# Patient Record
Sex: Female | Born: 1995
Health system: Southern US, Community
[De-identification: ages and names within clinical notes are randomized; demographics above are authoritative.]

## PROBLEM LIST (undated history)

## (undated) DIAGNOSIS — D649 Anemia, unspecified: Secondary | ICD-10-CM

## (undated) DIAGNOSIS — J45909 Unspecified asthma, uncomplicated: Secondary | ICD-10-CM

## (undated) DIAGNOSIS — Z8639 Personal history of other endocrine, nutritional and metabolic disease: Secondary | ICD-10-CM

## (undated) HISTORY — PX: ADENOIDECTOMY: SUR15

## (undated) HISTORY — DX: Anemia, unspecified: D64.9

## (undated) HISTORY — DX: Personal history of other endocrine, nutritional and metabolic disease: Z86.39

## (undated) HISTORY — DX: Unspecified asthma, uncomplicated: J45.909

## (undated) HISTORY — PX: TYMPANOSTOMY TUBE PLACEMENT: SHX32

---
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
## 2017-12-14 ENCOUNTER — Other Ambulatory Visit: Payer: Self-pay | Admitting: *Deleted

## 2017-12-14 MED ORDER — NORETHIN ACE-ETH ESTRAD-FE 1-20 MG-MCG PO TABS: 1.0000 | ORAL_TABLET | Freq: Every day | ORAL | 2 refills | Status: DC

## 2018-06-08 ENCOUNTER — Ambulatory Visit (INDEPENDENT_AMBULATORY_CARE_PROVIDER_SITE_OTHER): Payer: BLUE CROSS/BLUE SHIELD | Admitting: Family Medicine

## 2018-06-08 DIAGNOSIS — Z23 Encounter for immunization: Secondary | ICD-10-CM

## 2018-08-18 ENCOUNTER — Other Ambulatory Visit: Payer: Self-pay | Admitting: Sports Medicine

## 2018-08-21 ENCOUNTER — Other Ambulatory Visit: Payer: Self-pay | Admitting: *Deleted

## 2018-08-21 MED ORDER — NORETHIN ACE-ETH ESTRAD-FE 1-20 MG-MCG PO TABS: 1.0000 | ORAL_TABLET | Freq: Every day | ORAL | 1 refills | Status: DC

## 2018-10-31 ENCOUNTER — Telehealth: Payer: Self-pay | Admitting: Family Medicine

## 2018-10-31 NOTE — Telephone Encounter (Signed)
Copied from CRM [id number]. Topic: General - Other >> Oct 31, 2018  9:04 AM Herby Abraham C wrote: Reason for CRM:   Pt called in to request a copy of her immunizations. Pt says that she need this for school.    CB: [phone] -

## 2018-11-01 NOTE — Telephone Encounter (Signed)
Copy pulled from [facility] registry. Patient was informed copy at front desk

## 2018-11-02 NOTE — Telephone Encounter (Signed)
I got a verbal auth from her and printed [facility]IR off. Her dad came by and picked it up. She would now like to know if she needs any other shots for school. Please advise her at [phone]

## 2019-02-02 ENCOUNTER — Other Ambulatory Visit: Payer: Self-pay | Admitting: Sports Medicine

## 2019-02-03 ENCOUNTER — Other Ambulatory Visit: Payer: Self-pay

## 2019-02-03 ENCOUNTER — Ambulatory Visit (INDEPENDENT_AMBULATORY_CARE_PROVIDER_SITE_OTHER): Payer: BC Managed Care – PPO

## 2019-02-03 ENCOUNTER — Encounter (HOSPITAL_COMMUNITY): Payer: Self-pay | Admitting: *Deleted

## 2019-02-03 ENCOUNTER — Ambulatory Visit (HOSPITAL_COMMUNITY)
Admission: EM | Admit: 2019-02-03 | Discharge: 2019-02-03 | Disposition: A | Discharge status: Home / Self Care | Payer: BC Managed Care – PPO | Attending: Urgent Care | Admitting: Urgent Care

## 2019-02-03 DIAGNOSIS — M25572 Pain in left ankle and joints of left foot: Secondary | ICD-10-CM | POA: Diagnosis not present

## 2019-02-03 DIAGNOSIS — S99912A Unspecified injury of left ankle, initial encounter: Secondary | ICD-10-CM | POA: Diagnosis not present

## 2019-02-03 DIAGNOSIS — S93402A Sprain of unspecified ligament of left ankle, initial encounter: Secondary | ICD-10-CM | POA: Diagnosis not present

## 2019-02-03 DIAGNOSIS — M7989 Other specified soft tissue disorders: Secondary | ICD-10-CM | POA: Diagnosis not present

## 2019-02-03 MED ORDER — NAPROXEN 500 MG PO TABS: 500.0000 mg | ORAL_TABLET | Freq: Two times a day (BID) | ORAL | 0 refills | Status: DC

## 2019-02-03 MED ORDER — CYCLOBENZAPRINE HCL 10 MG PO TABS: 10.0000 mg | ORAL_TABLET | Freq: Every day | ORAL | 0 refills | Status: DC

## 2019-02-03 NOTE — ED Notes (Addendum)
States took IBU @ approx 1600.

## 2019-02-03 NOTE — ED Notes (Signed)
Upon assisting pt out to vehicle, mother stated they would actually like to decline crutches, as they have a pair at home.

## 2019-02-03 NOTE — ED Notes (Addendum)
Ice pack provided and placed to left lateral ankle.

## 2019-02-03 NOTE — Discharge Instructions (Addendum)
You can ice 20 minutes every 2 hours for the first 48 hours. After any shift from work, icing can help for 20 minutes when you get home.

## 2019-02-03 NOTE — ED Triage Notes (Signed)
Reports walking on uneven ground when she rolled left ankle, hearing/feeling "pops".  Swelling noted to left lateral ankle; states unable to bear any weight or touch area.  Declines elevating LLE due to pain.

## 2019-02-03 NOTE — ED Provider Notes (Signed)
MRN: [id number] DOB: May 13, 1996  Subjective:   Susan Cohen is a 23 y.o. female presenting for suffering a left ankle injury today.  Patient was walking on uneven ground and rolled her ankle outwardly.  She states that she heard a pop and has had severe pain since then with swelling.  She is not able to bear any weight on it and has not even been able to touch her ankle as it hurts severely.  Patient took ibuprofen just prior to coming to the clinic.  No current facility-administered medications for this encounter.   Current Outpatient Medications:  .  norethindrone-ethinyl estradiol (JUNEL FE,GILDESS FE,LOESTRIN FE) 1-20 MG-MCG tablet, Take 1 tablet by mouth daily., Disp: 90 tablet, Rfl: 1 .  albuterol (PROVENTIL HFA;VENTOLIN HFA) 108 (90 Base) MCG/ACT inhaler, Inhale 2 puffs into the lungs every 6 (six) hours as needed for wheezing or shortness of breath., Disp: 1 Inhaler, Rfl: 6 .  beclomethasone (QVAR) 80 MCG/ACT inhaler, Inhale 2 puffs into the lungs 2 (two) times daily., Disp: 1 Inhaler, Rfl: 12 .  cefdinir (OMNICEF) 300 MG capsule, Take 2 capsules (600 mg total) by mouth daily. (Patient not taking: Reported on 06/03/2016), Disp: 10 capsule, Rfl: 0 .  ELIDEL 1 % cream, APPLY ON THE SKIN BID PRN, Disp: , Rfl: 0 .  FERROCITE 324 MG TABS tablet, TAKE 1 TABLET BY MOUTH EVERY DAY, Disp: 30 tablet, Rfl: 2 .  ferrous sulfate 325 (65 FE) MG tablet, TAKE 1 TABLET (325 MG TOTAL) BY MOUTH DAILY WITH BREAKFAST., Disp: 30 tablet, Rfl: 3 .  FLUVIRIN SUSP, ADM 0.5ML IM UTD, Disp: , Rfl: 0 .  HYDROcodone-acetaminophen (NORCO) 7.5-325 MG tablet, TK 1 T PO Q 4 TO 6 H PRF PAIN, Disp: , Rfl: 0 .  ibuprofen (ADVIL,MOTRIN) 600 MG tablet, TK 1 T PO TID, Disp: , Rfl: 0 .  ipratropium (ATROVENT) 0.03 % nasal spray, Place 2 sprays into the nose 4 (four) times daily., Disp: 30 mL, Rfl: 6 .  montelukast (SINGULAIR) 10 MG tablet, Take 1 tablet (10 mg total) by mouth at bedtime., Disp: 30 tablet, Rfl: 5 .  nitroGLYCERIN  (NITRODUR - DOSED IN MG/24 HR) 0.2 mg/hr patch, Place 1/4 patch to affected area daily, Disp: 30 patch, Rfl: 1 .  Pseudoephedrine-Guaifenesin (MUCINEX D MAX STRENGTH) [phone] MG TB12, Take 1 tablet by mouth 2 (two) times daily. (Patient not taking: Reported on 06/03/2016), Disp: 20 each, Rfl: 0 .  Spacer/Aero-Holding Chambers (VALVED HOLDING CHAMBER) DEVI, U UTD WITH QVAR, Disp: , Rfl: 0   No Known Allergies  Past Medical History:  Diagnosis Date  . Anemia      Past Surgical History:  Procedure Laterality Date  . TYMPANOSTOMY TUBE PLACEMENT      ROS  Objective:   Vitals: BP 110/62   Resp 20   LMP 01/06/2019 (Exact Date)   SpO2 99%   Physical Exam Constitutional:      General: She is not in acute distress.    Appearance: Normal appearance. She is well-developed. She is not ill-appearing.  HENT:     Head: Normocephalic and atraumatic.     Nose: Nose normal.     Mouth/Throat:     Mouth: Mucous membranes are moist.     Pharynx: Oropharynx is clear.  Eyes:     General: No scleral icterus.    Extraocular Movements: Extraocular movements intact.     Pupils: Pupils are equal, round, and reactive to light.  Cardiovascular:     Rate and  Rhythm: Normal rate.  Pulmonary:     Effort: Pulmonary effort is normal.  Musculoskeletal:     Left ankle: She exhibits decreased range of motion and swelling. She exhibits no ecchymosis, no deformity, no laceration and normal pulse. Tenderness. Lateral malleolus and AITFL tenderness found. No medial malleolus, no posterior TFL, no head of 5th metatarsal and no proximal fibula tenderness found. Achilles tendon exhibits no pain, no defect and normal Thompson's test results.  Skin:    General: Skin is warm and dry.  Neurological:     General: No focal deficit present.     Mental Status: She is alert and oriented to person, place, and time.  Psychiatric:        Mood and Affect: Mood normal.        Behavior: Behavior normal.     Dg Ankle  Complete Left  Result Date: 02/03/2019 CLINICAL DATA:  Pain and swelling.  Injury. EXAM: LEFT ANKLE COMPLETE - 3+ VIEW COMPARISON:  None. FINDINGS: Lateral soft tissue swelling.  No fractures. IMPRESSION: Lateral soft tissue swelling.  No fractures. Electronically Signed   By: Gerome Samavid  Williams III M.D   On: 02/03/2019 17:52    Assessment and Plan :   1. Sprain of left ankle, unspecified ligament, initial encounter   2. Acute left ankle pain     We will manage with an Ace wrap and rice method for left ankle sprain.  Counseled on use of NSAID, muscle relaxant and modification of physical activity.  Anticipatory guidance provided.  Counseled patient on potential for adverse effects with medications prescribed/recommended today, ER and return-to-clinic precautions discussed, patient verbalized understanding.    Wallis BambergMani, Lorelee Mclaurin, [place]-C 02/03/19 [id number]

## 2019-02-10 ENCOUNTER — Other Ambulatory Visit: Payer: Self-pay | Admitting: Sports Medicine

## 2019-02-12 ENCOUNTER — Other Ambulatory Visit: Payer: Self-pay | Admitting: Sports Medicine

## 2019-02-15 ENCOUNTER — Other Ambulatory Visit: Payer: Self-pay | Admitting: *Deleted

## 2019-02-15 MED ORDER — NORETHIN ACE-ETH ESTRAD-FE 1-20 MG-MCG PO TABS: 1.0000 | ORAL_TABLET | Freq: Every day | ORAL | 0 refills | Status: DC

## 2019-02-28 ENCOUNTER — Ambulatory Visit: Payer: BC Managed Care – PPO | Admitting: Sports Medicine

## 2019-03-12 ENCOUNTER — Other Ambulatory Visit: Payer: Self-pay

## 2019-03-13 ENCOUNTER — Encounter: Payer: Self-pay | Admitting: Women's Health

## 2019-03-13 ENCOUNTER — Ambulatory Visit (INDEPENDENT_AMBULATORY_CARE_PROVIDER_SITE_OTHER): Payer: BC Managed Care – PPO | Admitting: Women's Health

## 2019-03-13 VITALS — BP 108/68 | Ht 64.0 in | Wt 114.0 lb

## 2019-03-13 DIAGNOSIS — Z01419 Encounter for gynecological examination (general) (routine) without abnormal findings: Secondary | ICD-10-CM | POA: Diagnosis not present

## 2019-03-13 DIAGNOSIS — R87610 Atypical squamous cells of undetermined significance on cytologic smear of cervix (ASC-US): Secondary | ICD-10-CM | POA: Diagnosis not present

## 2019-03-13 DIAGNOSIS — N898 Other specified noninflammatory disorders of vagina: Secondary | ICD-10-CM | POA: Diagnosis not present

## 2019-03-13 LAB — CBC WITH DIFFERENTIAL/PLATELET
Absolute Monocytes: 660 cells/uL (ref 200–950)
Basophils Absolute: 33 cells/uL (ref 0–200)
Basophils Relative: 0.3 %
Eosinophils Absolute: 231 cells/uL (ref 15–500)
Eosinophils Relative: 2.1 %
HCT: 39.1 % (ref 35.0–45.0)
Hemoglobin: 13.1 g/dL (ref 11.7–15.5)
Lymphs Abs: 3707 cells/uL (ref 850–3900)
MCH: 31.5 pg (ref 27.0–33.0)
MCHC: 33.5 g/dL (ref 32.0–36.0)
MCV: 94 fL (ref 80.0–100.0)
MPV: 10.9 fL (ref 7.5–12.5)
Monocytes Relative: 6 %
Neutro Abs: 6369 cells/uL (ref 1500–7800)
Neutrophils Relative %: 57.9 %
Platelets: 202 10*3/uL (ref 140–400)
RBC: 4.16 10*6/uL (ref 3.80–5.10)
RDW: 12.4 % (ref 11.0–15.0)
Total Lymphocyte: 33.7 %
WBC: 11 10*3/uL — ABNORMAL HIGH (ref 3.8–10.8)

## 2019-03-13 LAB — WET PREP FOR TRICH, YEAST, CLUE

## 2019-03-13 LAB — RESULTS CONSOLE HPV: CHL HPV: NEGATIVE

## 2019-03-13 MED ORDER — NORGESTIMATE-ETH ESTRADIOL 0.25-35 MG-MCG PO TABS: 1.0000 | ORAL_TABLET | Freq: Every day | ORAL | 4 refills | Status: DC

## 2019-03-13 NOTE — Progress Notes (Signed)
Susan Cohen 1996-02-22 [id number]    History:    Presents for annual exam.  Monthly cycle on Loestrin but reports heavy cycles every other month where she is changing protection every hour for the first 2 to 3 days.  Cycle started at age 23 and have never been regular unless on birth control.  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  Gardasil series completed.  Cross-country and track runner, history of anemia.  Reports good appetite, underweight.  Past medical history, past surgical history, family history and social history were all reviewed and documented in the EPIC chart.  Transferred to [facility] from [place] [facility]  ROS:  A ROS was performed and pertinent positives and negatives are included.  Exam:  Vitals:   03/13/19 1546  BP: 108/68  Weight: 114 lb (51.7 kg)  Height: 5\' 4"  (1.626 m)   Body mass index is 19.57 kg/m.   General appearance:  Normal Thyroid:  Symmetrical, normal in size, without palpable masses or nodularity. Respiratory  Auscultation:  Clear without wheezing or rhonchi Cardiovascular  Auscultation:  Regular rate, without rubs, murmurs or gallops  Edema/varicosities:  Not grossly evident Abdominal  Soft,nontender, without masses, guarding or rebound.  Liver/spleen:  No organomegaly noted  Hernia:  None appreciated  Skin  Inspection:  Grossly normal   Breasts: Examined lying and sitting.     Right: Without masses, retractions, discharge or axillary adenopathy.     Left: Without masses, retractions, discharge or axillary adenopathy. Gentitourinary   Inguinal/mons:  Normal without inguinal adenopathy  External genitalia:  Normal  BUS/Urethra/Skene's glands:  Normal  Vagina:  Normal  Cervix:  Normal  Uterus:   normal in size, shape and contour.  Midline and mobile  Adnexa/parametria:     Rt: Without masses or tenderness.   Lt: Without masses or tenderness.  Anus and  perineum: Normal    Assessment/Plan:  23 y.o. S WF G0 for annual exam with complaint of heavy cycles every other month.  Monthly cycle on Loestrin with every other month with menorrhagia  Plan: Options reviewed, will try different OC, Sprintec prescription, proper use given and reviewed slight risk for blood clots and strokes.  Reviewed importance of condoms until permanent partner.  Instructed to call if continued problems with heavy cycles.  Declines need for STD screen.  SBEs, continue healthy lifestyle of regular exercise of running and biking.  Campus safety reviewed.  MVI daily with iron and iron rich foods encouraged,  history of anemia.  CBC, Pap.    [place], 5:22 PM 03/13/2019

## 2019-03-13 NOTE — Patient Instructions (Addendum)
It was nice meeting you, keep running!!  Health Maintenance, Female Adopting a healthy lifestyle and getting preventive care are important in promoting health and wellness. Ask your health care provider about:  The right schedule for you to have regular tests and exams.  Things you can do on your own to prevent diseases and keep yourself healthy. What should I know about diet, weight, and exercise? Eat a healthy diet   Eat a diet that includes plenty of vegetables, fruits, low-fat dairy products, and lean protein.  Do not eat a lot of foods that are high in solid fats, added sugars, or sodium. Maintain a healthy weight Body mass index (BMI) is used to identify weight problems. It estimates body fat based on height and weight. Your health care provider can help determine your BMI and help you achieve or maintain a healthy weight. Get regular exercise Get regular exercise. This is one of the most important things you can do for your health. Most adults should:  Exercise for at least 150 minutes each week. The exercise should increase your heart rate and make you sweat (moderate-intensity exercise).  Do strengthening exercises at least twice a week. This is in addition to the moderate-intensity exercise.  Spend less time sitting. Even light physical activity can be beneficial. Watch cholesterol and blood lipids Have your blood tested for lipids and cholesterol at 21 years of age, then have this test every 5 years. Have your cholesterol levels checked more often if:  Your lipid or cholesterol levels are high.  You are older than 21 years of age.  You are at high risk for heart disease. What should I know about cancer screening? Depending on your health history and family history, you may need to have cancer screening at various ages. This may include screening for:  Breast cancer.  Cervical cancer.  Colorectal cancer.  Skin cancer.  Lung cancer. What should I know about  heart disease, diabetes, and high blood pressure? Blood pressure and heart disease  High blood pressure causes heart disease and increases the risk of stroke. This is more likely to develop in people who have high blood pressure readings, are of African descent, or are overweight.  Have your blood pressure checked: ? Every 3-5 years if you are 60-77 years of age. ? Every year if you are 3 years old or older. Diabetes Have regular diabetes screenings. This checks your fasting blood sugar level. Have the screening done:  Once every three years after age 78 if you are at a normal weight and have a low risk for diabetes.  More often and at a younger age if you are overweight or have a high risk for diabetes. What should I know about preventing infection? Hepatitis B If you have a higher risk for hepatitis B, you should be screened for this virus. Talk with your health care provider to find out if you are at risk for hepatitis B infection. Hepatitis C Testing is recommended for:  Everyone born from [birth year] through 1963.  Anyone with known risk factors for hepatitis C. Sexually transmitted infections (STIs)  Get screened for STIs, including gonorrhea and chlamydia, if: ? You are sexually active and are younger than 21 years of age. ? You are older than 20 years of age and your health care provider tells you that you are at risk for this type of infection. ? Your sexual activity has changed since you were last screened, and you are at increased risk for chlamydia  or gonorrhea. Ask your health care provider if you are at risk.  Ask your health care provider about whether you are at high risk for HIV. Your health care provider may recommend a prescription medicine to help prevent HIV infection. If you choose to take medicine to prevent HIV, you should first get tested for HIV. You should then be tested every 3 months for as long as you are taking the medicine. Pregnancy  If you are about to  stop having your period (premenopausal) and you may become pregnant, seek counseling before you get pregnant.  Take 400 to 800 micrograms (mcg) of folic acid every day if you become pregnant.  Ask for birth control (contraception) if you want to prevent pregnancy. Osteoporosis and menopause Osteoporosis is a disease in which the bones lose minerals and strength with aging. This can result in bone fractures. If you are 28 years old or older, or if you are at risk for osteoporosis and fractures, ask your health care provider if you should:  Be screened for bone loss.  Take a calcium or vitamin D supplement to lower your risk of fractures.  Be given hormone replacement therapy (HRT) to treat symptoms of menopause. Follow these instructions at home: Lifestyle  Do not use any products that contain nicotine or tobacco, such as cigarettes, e-cigarettes, and chewing tobacco. If you need help quitting, ask your health care provider.  Do not use street drugs.  Do not share needles.  Ask your health care provider for help if you need support or information about quitting drugs. Alcohol use  Do not drink alcohol if: ? Your health care provider tells you not to drink. ? You are pregnant, may be pregnant, or are planning to become pregnant.  If you drink alcohol: ? Limit how much you use to 0-1 drink a day. ? Limit intake if you are breastfeeding.  Be aware of how much alcohol is in your drink. In the [place] one drink equals one 12 oz bottle of beer (355 mL), one 5 oz glass of wine (148 mL), or one 1 oz glass of hard liquor (44 mL). General instructions  Schedule regular health, dental, and eye exams.  Stay current with your vaccines.  Tell your health care provider if: ? You often feel depressed. ? You have ever been abused or do not feel safe at home. Summary  Adopting a healthy lifestyle and getting preventive care are important in promoting health and wellness.  Follow your  health care provider's instructions about healthy diet, exercising, and getting tested or screened for diseases.  Follow your health care provider's instructions on monitoring your cholesterol and blood pressure. This information is not intended to replace advice given to you by your health care provider. Make sure you discuss any questions you have with your health care provider. Document Released: 12/06/2010 Document Revised: 05/16/2018 Document Reviewed: 05/16/2018 Elsevier Patient Education  2020 [employer].

## 2019-03-19 LAB — PAP IG W/ RFLX HPV ASCU

## 2019-03-19 LAB — HUMAN PAPILLOMAVIRUS, HIGH RISK: HPV DNA High Risk: NOT DETECTED

## 2019-12-20 ENCOUNTER — Telehealth: Payer: Self-pay

## 2019-12-20 NOTE — Telephone Encounter (Signed)
Note written and emailed to pt at [email or web address] per pt request.

## 2020-03-18 ENCOUNTER — Other Ambulatory Visit: Payer: Self-pay

## 2020-03-18 MED ORDER — NORGESTIMATE-ETH ESTRADIOL 0.25-35 MG-MCG PO TABS: 1.0000 | ORAL_TABLET | Freq: Every day | ORAL | 0 refills | Status: DC

## 2020-03-18 NOTE — Telephone Encounter (Signed)
Has her annual exam schduled for 03/26/20 with TW.

## 2020-03-26 ENCOUNTER — Other Ambulatory Visit: Payer: Self-pay

## 2020-03-26 ENCOUNTER — Ambulatory Visit (INDEPENDENT_AMBULATORY_CARE_PROVIDER_SITE_OTHER): Payer: BC Managed Care – PPO | Admitting: Nurse Practitioner

## 2020-03-26 ENCOUNTER — Encounter: Payer: Self-pay | Admitting: Nurse Practitioner

## 2020-03-26 VITALS — BP 118/80 | Ht 64.0 in | Wt 120.0 lb

## 2020-03-26 DIAGNOSIS — R87610 Atypical squamous cells of undetermined significance on cytologic smear of cervix (ASC-US): Secondary | ICD-10-CM | POA: Diagnosis not present

## 2020-03-26 DIAGNOSIS — Z01419 Encounter for gynecological examination (general) (routine) without abnormal findings: Secondary | ICD-10-CM | POA: Diagnosis not present

## 2020-03-26 DIAGNOSIS — Z3041 Encounter for surveillance of contraceptive pills: Secondary | ICD-10-CM | POA: Diagnosis not present

## 2020-03-26 LAB — HM PAP SMEAR: HM Pap smear: NEGATIVE

## 2020-03-26 MED ORDER — NORGESTIMATE-ETH ESTRADIOL 0.25-35 MG-MCG PO TABS: 1.0000 | ORAL_TABLET | Freq: Every day | ORAL | 3 refills | Status: DC

## 2020-03-26 NOTE — Patient Instructions (Signed)
Health Maintenance, Female Adopting a healthy lifestyle and getting preventive care are important in promoting health and wellness. Ask your health care provider about:  The right schedule for you to have regular tests and exams.  Things you can do on your own to prevent diseases and keep yourself healthy. What should I know about diet, weight, and exercise? Eat a healthy diet   Eat a diet that includes plenty of vegetables, fruits, low-fat dairy products, and lean protein.  Do not eat a lot of foods that are high in solid fats, added sugars, or sodium. Maintain a healthy weight Body mass index (BMI) is used to identify weight problems. It estimates body fat based on height and weight. Your health care provider can help determine your BMI and help you achieve or maintain a healthy weight. Get regular exercise Get regular exercise. This is one of the most important things you can do for your health. Most adults should:  Exercise for at least 150 minutes each week. The exercise should increase your heart rate and make you sweat (moderate-intensity exercise).  Do strengthening exercises at least twice a week. This is in addition to the moderate-intensity exercise.  Spend less time sitting. Even light physical activity can be beneficial. Watch cholesterol and blood lipids Have your blood tested for lipids and cholesterol at 22 years of age, then have this test every 5 years. Have your cholesterol levels checked more often if:  Your lipid or cholesterol levels are high.  You are older than 22 years of age.  You are at high risk for heart disease. What should I know about cancer screening? Depending on your health history and family history, you may need to have cancer screening at various ages. This may include screening for:  Breast cancer.  Cervical cancer.  Colorectal cancer.  Skin cancer.  Lung cancer. What should I know about heart disease, diabetes, and high blood  pressure? Blood pressure and heart disease  High blood pressure causes heart disease and increases the risk of stroke. This is more likely to develop in people who have high blood pressure readings, are of African descent, or are overweight.  Have your blood pressure checked: ? Every 3-5 years if you are 18-39 years of age. ? Every year if you are 40 years old or older. Diabetes Have regular diabetes screenings. This checks your fasting blood sugar level. Have the screening done:  Once every three years after age 40 if you are at a normal weight and have a low risk for diabetes.  More often and at a younger age if you are overweight or have a high risk for diabetes. What should I know about preventing infection? Hepatitis B If you have a higher risk for hepatitis B, you should be screened for this virus. Talk with your health care provider to find out if you are at risk for hepatitis B infection. Hepatitis C Testing is recommended for:  Everyone born from 1945 through 1965.  Anyone with known risk factors for hepatitis C. Sexually transmitted infections (STIs)  Get screened for STIs, including gonorrhea and chlamydia, if: ? You are sexually active and are younger than 22 years of age. ? You are older than 22 years of age and your health care provider tells you that you are at risk for this type of infection. ? Your sexual activity has changed since you were last screened, and you are at increased risk for chlamydia or gonorrhea. Ask your health care provider if   you are at risk.  Ask your health care provider about whether you are at high risk for HIV. Your health care provider may recommend a prescription medicine to help prevent HIV infection. If you choose to take medicine to prevent HIV, you should first get tested for HIV. You should then be tested every 3 months for as long as you are taking the medicine. Pregnancy  If you are about to stop having your period (premenopausal) and  you may become pregnant, seek counseling before you get pregnant.  Take 400 to 800 micrograms (mcg) of folic acid every day if you become pregnant.  Ask for birth control (contraception) if you want to prevent pregnancy. Osteoporosis and menopause Osteoporosis is a disease in which the bones lose minerals and strength with aging. This can result in bone fractures. If you are 65 years old or older, or if you are at risk for osteoporosis and fractures, ask your health care provider if you should:  Be screened for bone loss.  Take a calcium or vitamin D supplement to lower your risk of fractures.  Be given hormone replacement therapy (HRT) to treat symptoms of menopause. Follow these instructions at home: Lifestyle  Do not use any products that contain nicotine or tobacco, such as cigarettes, e-cigarettes, and chewing tobacco. If you need help quitting, ask your health care provider.  Do not use street drugs.  Do not share needles.  Ask your health care provider for help if you need support or information about quitting drugs. Alcohol use  Do not drink alcohol if: ? Your health care provider tells you not to drink. ? You are pregnant, may be pregnant, or are planning to become pregnant.  If you drink alcohol: ? Limit how much you use to 0-1 drink a day. ? Limit intake if you are breastfeeding.  Be aware of how much alcohol is in your drink. In the [place] one drink equals one 12 oz bottle of beer (355 mL), one 5 oz glass of wine (148 mL), or one 1 oz glass of hard liquor (44 mL). General instructions  Schedule regular health, dental, and eye exams.  Stay current with your vaccines.  Tell your health care provider if: ? You often feel depressed. ? You have ever been abused or do not feel safe at home. Summary  Adopting a healthy lifestyle and getting preventive care are important in promoting health and wellness.  Follow your health care provider's instructions about healthy  diet, exercising, and getting tested or screened for diseases.  Follow your health care provider's instructions on monitoring your cholesterol and blood pressure. This information is not intended to replace advice given to you by your health care provider. Make sure you discuss any questions you have with your health care provider. Document Revised: 05/16/2018 Document Reviewed: 05/16/2018 Elsevier Patient Education  2020 [employer]  

## 2020-03-26 NOTE — Progress Notes (Signed)
   Susan Cohen Jun 21, 1995 [id number]   History:  24 y.o. G presents for annual exam without GYN complaints. Regular monthly cycle, OCPs. 2020 ASCUS negative HPV. Gardasil series completed.  Sexually active with one partner.   Gynecologic History Patient's last menstrual period was 03/19/2020. Period Cycle (Days): 28 Period Duration (Days): 5 Period Pattern: Regular Menstrual Flow: Moderate Menstrual Control: Maxi pad, Tampon Dysmenorrhea: (!) Mild Dysmenorrhea Symptoms: Cramping Contraception: OCP (estrogen/progesterone) Last Pap: 03/13/2019. Results were: ASCUS negative HPV   Past medical history, past surgical history, family history and social history were all reviewed and documented in the EPIC chart.  ROS:  A ROS was performed and pertinent positives and negatives are included.  Exam:  Vitals:   03/26/20 1510  BP: 118/80  Weight: 120 lb (54.4 kg)  Height: 5\' 4"  (1.626 m)   Body mass index is 20.6 kg/m.  General appearance:  Normal Thyroid:  Symmetrical, normal in size, without palpable masses or nodularity. Respiratory  Auscultation:  Clear without wheezing or rhonchi Cardiovascular  Auscultation:  Regular rate, without rubs, murmurs or gallops  Edema/varicosities:  Not grossly evident Abdominal  Soft,nontender, without masses, guarding or rebound.  Liver/spleen:  No organomegaly noted  Hernia:  None appreciated  Skin  Inspection:  Grossly normal   Breasts: Examined lying and sitting.   Right: Without masses, retractions, discharge or axillary adenopathy.   Left: Without masses, retractions, discharge or axillary adenopathy. Gentitourinary   Inguinal/mons:  Normal without inguinal adenopathy  External genitalia:  Normal  BUS/Urethra/Skene's glands:  Normal  Vagina:  Normal  Cervix:  Normal  Uterus:  Normal in size, shape and contour.  Midline and mobile  Adnexa/parametria:     Rt: Without masses or tenderness.   Lt: Without masses or tenderness.  Anus  and perineum: Normal   Assessment/Plan:  24 y.o. G0 for annual exam.   Well female exam with routine gynecological exam - Education provided on SBEs, importance of preventative screenings, current guidelines, high calcium diet, regular exercise, and multivitamin daily.   Encounter for surveillance of contraceptive pills - Plan: norgestimate-ethinyl estradiol (ORTHO-CYCLEN) 0.25-35 MG-MCG tablet. Taking as prescribed. Refill x 1 year provided.   ASCUS of cervix with negative high risk HPV - 2020. Pap today  Follow up in 1 year for annual       20[facility], 3:15 PM 03/26/2020

## 2020-03-26 NOTE — Addendum Note (Signed)
Addended by: Tito Dine on: 03/26/2020 04:23 PM   Modules accepted: Orders

## 2020-03-30 LAB — PAP IG W/ RFLX HPV ASCU

## 2020-08-03 IMAGING — DX LEFT ANKLE COMPLETE - 3+ VIEW
3 series · 3 of 3 positions shown · non-contrast
Comparison: None.

CLINICAL DATA: Pain and swelling.  Injury.

EXAM:
LEFT ANKLE COMPLETE - 3+ VIEW

[ankle ap]
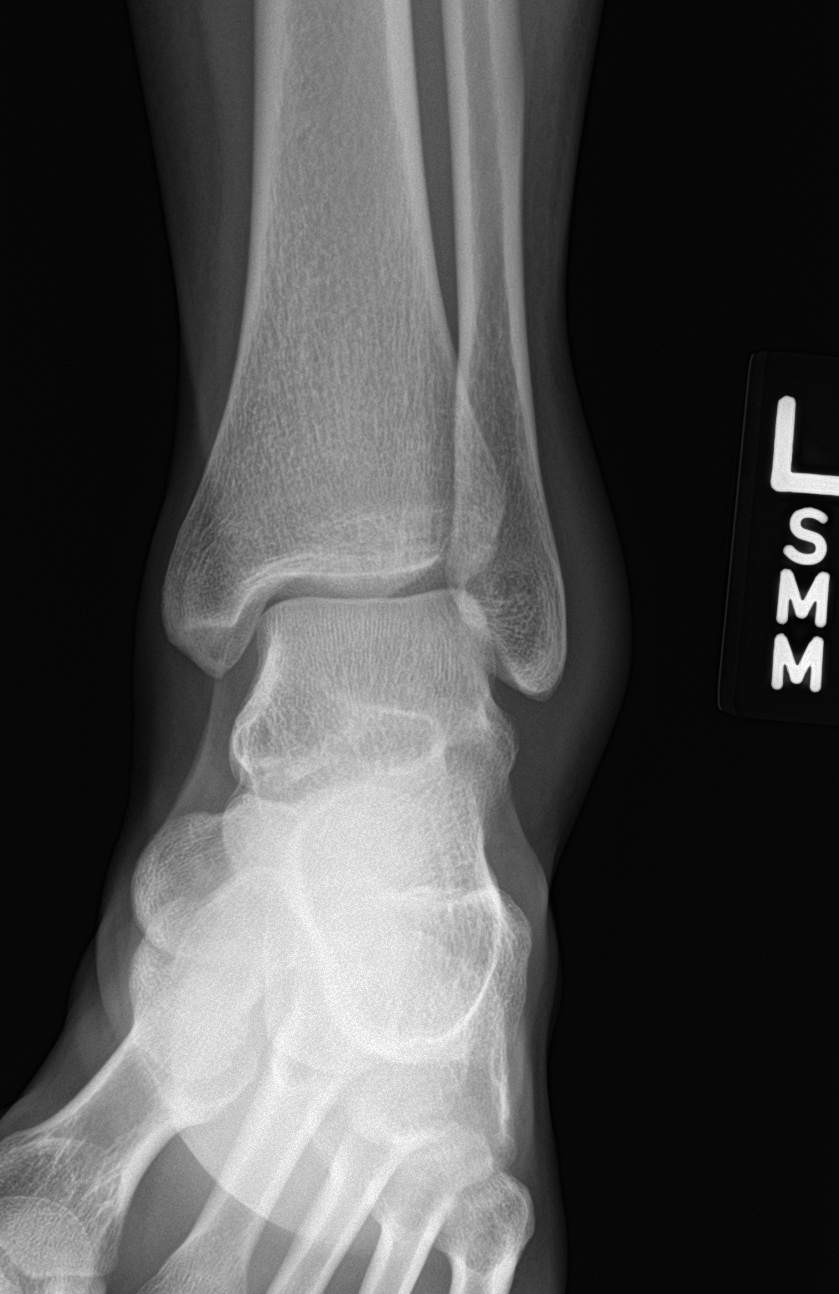

[ankle obl]
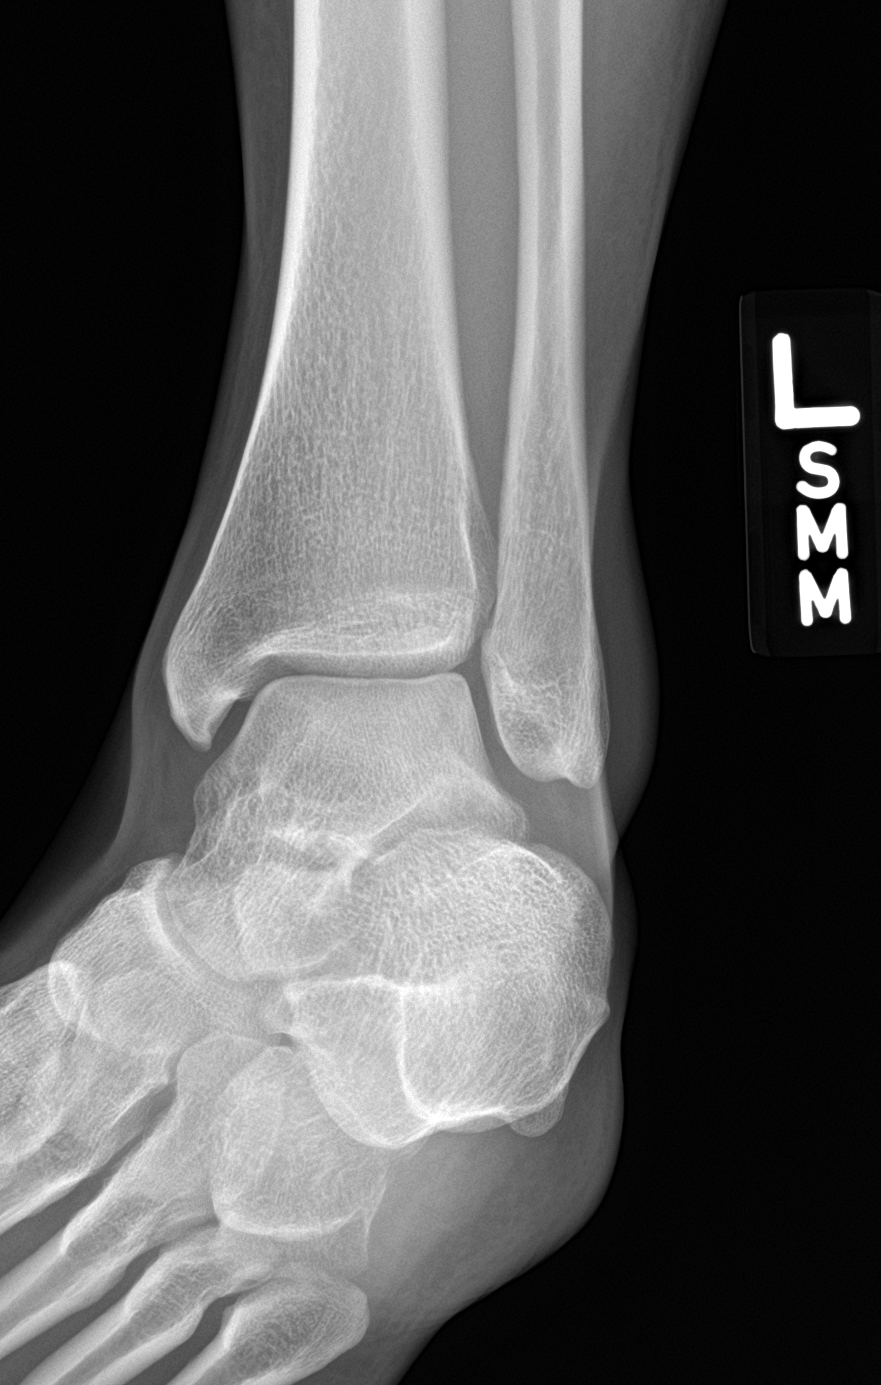

[ankle lat]
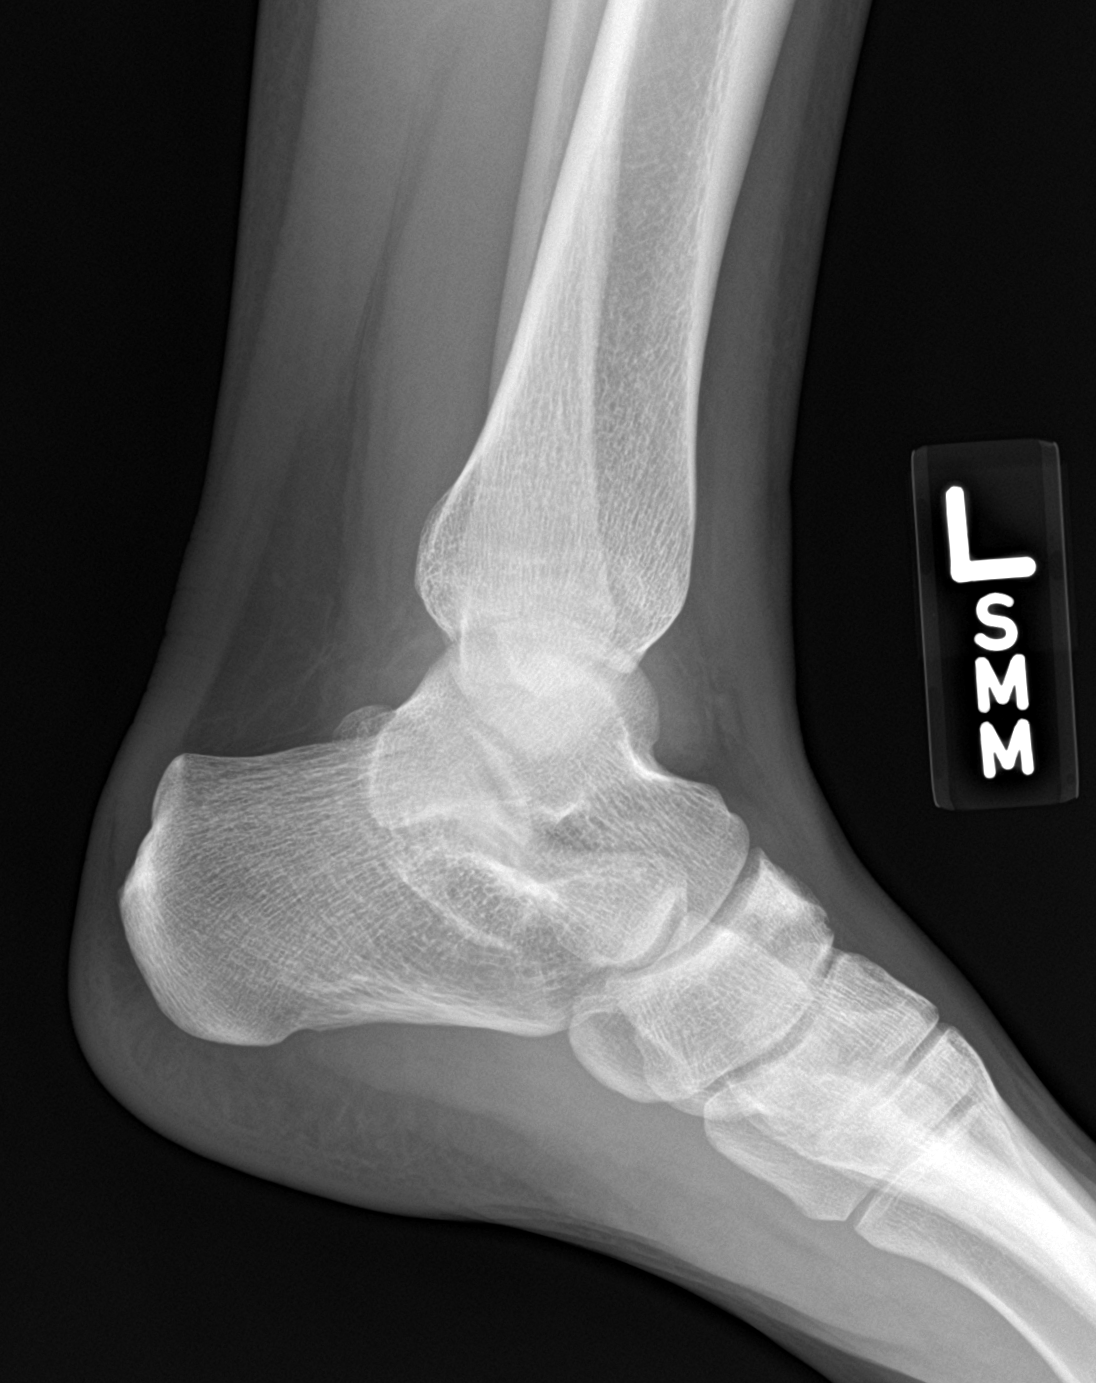

[3 of 3 positions shown; findings below may reference images not displayed]

FINDINGS: Lateral soft tissue swelling.  No fractures.
IMPRESSION: Lateral soft tissue swelling.  No fractures.

## 2020-09-02 ENCOUNTER — Ambulatory Visit
Admission: RE | Admit: 2020-09-02 | Discharge: 2020-09-02 | Disposition: A | Discharge status: Home / Self Care | Payer: BC Managed Care – PPO | Source: Ambulatory Visit | Attending: Sports Medicine | Admitting: Sports Medicine

## 2020-09-02 ENCOUNTER — Other Ambulatory Visit: Payer: Self-pay

## 2020-09-02 VITALS — BP 110/68 | HR 75 | Temp 98.0°F | Resp 18 | Ht 65.0 in | Wt 118.0 lb

## 2020-09-02 DIAGNOSIS — R0982 Postnasal drip: Secondary | ICD-10-CM

## 2020-09-02 DIAGNOSIS — J069 Acute upper respiratory infection, unspecified: Secondary | ICD-10-CM | POA: Diagnosis not present

## 2020-09-02 DIAGNOSIS — J029 Acute pharyngitis, unspecified: Secondary | ICD-10-CM | POA: Diagnosis not present

## 2020-09-02 DIAGNOSIS — R0981 Nasal congestion: Secondary | ICD-10-CM

## 2020-09-02 LAB — GROUP A STREP BY PCR: Group A Strep by PCR: NOT DETECTED

## 2020-09-02 NOTE — Discharge Instructions (Signed)
Your strep culture was pending at the time of discharge.  If it is positive someone will contact you. You have a viral upper respiratory infection with minimal cough and head congestion.  I recommend continuing with the Mucinex without DM 1200 mg twice a day. I have given you some educational handouts. Tylenol or Motrin for any fever or discomfort. I also provided a work note. If you have any further issues or your symptoms or not resolving, please come back here or see your primary care provider.  I hope you get to feeling better, Dr. Zachery Dauer

## 2020-09-02 NOTE — ED Provider Notes (Signed)
[facility]    CSN: [id number] Arrival date & time: 09/02/20  [id number]      History   Chief Complaint Chief Complaint  Patient presents with  . Nasal Congestion  . Sore Throat    HPI Susan Cohen is a 25 y.o. female.   Patient is a pleasant 25 year old female who presents for evaluation of the above issues.  She has recently completed college and she is working as a Building services engineerflorist currently.  She is going to nursing school in the fall.  She says she has had a sore throat and mucus production with a mild cough and head congestion for about 5 days.  No chest congestion.  A little bit of sinus pressure and postnasal drip.  No ear pain.  She does have a history of strep throat and but the last time she had it was 4 years ago.  She has been vaccinated against COVID but has not received the booster.  She has not received her flu shot.  No COVID exposure or COVID history.  No strep exposure.  No abdominal or urinary symptoms.  She has been using Mucinex with limited success.  She presents today for initial evaluation.  No chest pain or shortness of breath.  No red flag signs or symptoms appreciated on history.     Past Medical History:  Diagnosis Date  . Anemia     Patient Active Problem List   Diagnosis Date Noted  . Subacromial bursitis of right shoulder joint 04/27/2016  . Asthma 03/24/2015  . Dysfunctional uterine bleeding 01/21/2014  . Anemia, iron deficiency 12/10/2013  . Low ferritin level 12/10/2013  . Left hamstring injury 08/13/2013    Past Surgical History:  Procedure Laterality Date  . TYMPANOSTOMY TUBE PLACEMENT      OB History   No obstetric history on file.      Home Medications    Prior to Admission medications   Medication Sig Start Date End Date Taking? Authorizing Provider  albuterol (PROVENTIL HFA;VENTOLIN HFA) 108 (90 Base) MCG/ACT inhaler Inhale 2 puffs into the lungs every 6 (six) hours as needed for wheezing or shortness of breath. 12/15/15   Yes Enid BaasFields, Karl, MD  norgestimate-ethinyl estradiol (ORTHO-CYCLEN) 0.25-35 MG-MCG tablet Take 1 tablet by mouth daily. 03/26/20  Yes Wyline BeadyWallace, Tiffany A, NP  cyclobenzaprine (FLEXERIL) 10 MG tablet Take 1 tablet (10 mg total) by mouth at bedtime. 02/03/19   Wallis BambergMani, Mario, PA-C  naproxen (NAPROSYN) 500 MG tablet Take 1 tablet (500 mg total) by mouth 2 (two) times daily. 02/03/19   Wallis BambergMani, Mario, PA-C  beclomethasone (QVAR) 80 MCG/ACT inhaler Inhale 2 puffs into the lungs 2 (two) times daily. 03/04/15 02/03/19  Enid BaasFields, Karl, MD  FERROCITE 324 MG TABS tablet TAKE 1 TABLET BY MOUTH EVERY DAY 01/07/16 02/03/19  Enid BaasFields, Karl, MD  ferrous sulfate 325 (65 FE) MG tablet TAKE 1 TABLET (325 MG TOTAL) BY MOUTH DAILY WITH BREAKFAST. 07/08/16 02/03/19  Enid BaasFields, Karl, MD  ipratropium (ATROVENT) 0.03 % nasal spray Place 2 sprays into the nose 4 (four) times daily. 01/21/15 02/03/19  Copland, Gwenlyn FoundJessica C, MD  montelukast (SINGULAIR) 10 MG tablet Take 1 tablet (10 mg total) by mouth at bedtime. 01/21/15 02/03/19  Copland, Gwenlyn FoundJessica C, MD  nitroGLYCERIN (NITRODUR - DOSED IN MG/24 HR) 0.2 mg/hr patch Place 1/4 patch to affected area daily 04/27/16 02/03/19  Enid BaasFields, Karl, MD    Family History Family History  Problem Relation Age of Onset  . Healthy Mother   . Healthy  Father   . Healthy Sister     Social History Social History   Tobacco Use  . Smoking status: Never Smoker  . Smokeless tobacco: Never Used  Vaping Use  . Vaping Use: Never used  Substance Use Topics  . Alcohol use: Yes    Comment: occassionally  . Drug use: Never     Allergies   Patient has no known allergies.   Review of Systems Review of Systems  Constitutional: Positive for fever. Negative for activity change, appetite change, chills, diaphoresis and fatigue.  HENT: Positive for congestion, postnasal drip, sinus pressure and sore throat. Negative for ear discharge, ear pain, rhinorrhea, sinus pain and sneezing.   Eyes: Negative for pain.  Respiratory:  Positive for cough. Negative for chest tightness, shortness of breath, wheezing and stridor.   Cardiovascular: Negative for chest pain and palpitations.  Gastrointestinal: Negative for abdominal pain, constipation, diarrhea, nausea and vomiting.  Genitourinary: Negative.  Negative for dysuria and frequency.  Musculoskeletal: Negative.  Negative for arthralgias and myalgias.  Skin: Negative.  Negative for color change, pallor, rash and wound.  Neurological: Negative.  Negative for dizziness, seizures, syncope, weakness and headaches.  All other systems reviewed and are negative.    Physical Exam Triage Vital Signs ED Triage Vitals  Enc Vitals Group     BP 09/02/20 0907 110/68     Pulse Rate 09/02/20 0907 75     Resp 09/02/20 0907 18     Temp 09/02/20 0907 98 F (36.7 C)     Temp Source 09/02/20 0907 Oral     SpO2 09/02/20 0907 100 %     Weight 09/02/20 0905 118 lb (53.5 kg)     Height 09/02/20 0905 5\' 5"  (1.651 m)     Head Circumference --      Peak Flow --      Pain Score 09/02/20 0905 6     Pain Loc --      Pain Edu? --      Excl. in GC? --    No data found.  Updated Vital Signs BP 110/68 (BP Location: Left Arm)   Pulse 75   Temp 98 F (36.7 C) (Oral)   Resp 18   Ht 5\' 5"  (1.651 m)   Wt 53.5 kg   LMP 08/12/2020 (Approximate)   SpO2 100%   BMI 19.64 kg/m   Visual Acuity Right Eye Distance:   Left Eye Distance:   Bilateral Distance:    Right Eye Near:   Left Eye Near:    Bilateral Near:     Physical Exam Vitals and nursing note reviewed.  Constitutional:      General: She is not in acute distress.    Appearance: She is well-developed. She is not ill-appearing, toxic-appearing or diaphoretic.  HENT:     Head: Normocephalic and atraumatic.     Right Ear: Tympanic membrane normal.     Left Ear: Tympanic membrane normal.     Nose: Congestion present. No rhinorrhea.     Mouth/Throat:     Mouth: Mucous membranes are moist. No oral lesions.     Pharynx:  Posterior oropharyngeal erythema present. No pharyngeal swelling, oropharyngeal exudate or uvula swelling.     Tonsils: No tonsillar exudate or tonsillar abscesses. 1+ on the right. 1+ on the left.  Eyes:     Extraocular Movements:     Right eye: Normal extraocular motion.     Left eye: Normal extraocular motion.     Conjunctiva/sclera: Conjunctivae  normal.     Pupils: Pupils are equal, round, and reactive to light.  Neck:     Thyroid: No thyromegaly.  Cardiovascular:     Rate and Rhythm: Normal rate and regular rhythm.     Heart sounds: Normal heart sounds. No murmur heard. No friction rub. No gallop.   Pulmonary:     Effort: Pulmonary effort is normal. No respiratory distress.     Breath sounds: Normal breath sounds. No stridor. No wheezing, rhonchi or rales.  Musculoskeletal:     Cervical back: Normal range of motion and neck supple.  Lymphadenopathy:     Cervical: Cervical adenopathy present.  Skin:    General: Skin is warm and dry.     Capillary Refill: Capillary refill takes less than 2 seconds.     Findings: Erythema present. No rash.  Neurological:     General: No focal deficit present.     Mental Status: She is alert and oriented to person, place, and time.      UC Treatments / Results  Labs (all labs ordered are listed, but only abnormal results are displayed) Labs Reviewed  GROUP A STREP BY PCR    EKG   Radiology No results found.  Procedures Procedures (including critical care time)  Medications Ordered in UC Medications - No data to display  Initial Impression / Assessment and Plan / UC Course  I have reviewed the triage vital signs and the nursing notes.  Pertinent labs & imaging results that were available during my care of the patient were reviewed by me and considered in my medical decision making (see chart for details).  Clinical impression: Sore throat, viral URI with cough, sinus congestion and nasal congestion with postnasal drip.  She  has had her symptoms now for 5 days.  She actually feels better today than she did yesterday and seems to be resolving.  Treatment plan: 1.  The findings and treatment plan were discussed in detail with the patient.  Patient was in agreement. 2.  Recommended getting a strep test.  It was negative in the office today. 3.  Educational handout was provided. 4.  History, examination, and vitals are consistent with a viral process.  Recommended Tylenol Motrin for any fever or discomfort. 5.  Continue with the Mucinex to thin secretions.  1200 mg twice a day without the DM component. 6.  Provided a work note saying she was seen today.  She wants to go back to work today. 7.  Follow-up with her primary care provider if symptoms persist.  Follow-up here as needed.    Final Clinical Impressions(s) / UC Diagnoses   Final diagnoses:  Sore throat  Viral URI with cough  Post-nasal drip  Sinus congestion  Nasal congestion     Discharge Instructions     Your strep culture was pending at the time of discharge.  If it is positive someone will contact you. You have a viral upper respiratory infection with minimal cough and head congestion.  I recommend continuing with the Mucinex without DM 1200 mg twice a day. I have given you some educational handouts. Tylenol or Motrin for any fever or discomfort. I also provided a work note. If you have any further issues or your symptoms or not resolving, please come back here or see your primary care provider.  I hope you get to feeling better, Dr. Zachery Dauer    ED Prescriptions    None     PDMP not reviewed this encounter.  Delton See, MD 09/02/20 1028

## 2020-09-02 NOTE — ED Triage Notes (Signed)
Pt c/o pnd/drainage, sinus congestion and sore throat for about 5 days. Pt has taken Mucinex with minimal improvement. Pt denies ear pain. Pt states she may have run a fever. She is hoarse. Pt denies n/v/d or other symptoms.

## 2021-03-29 ENCOUNTER — Ambulatory Visit: Payer: Self-pay | Admitting: Nurse Practitioner

## 2021-05-11 ENCOUNTER — Other Ambulatory Visit: Payer: Self-pay | Admitting: Nurse Practitioner

## 2021-05-11 DIAGNOSIS — Z3041 Encounter for surveillance of contraceptive pills: Secondary | ICD-10-CM

## 2021-05-26 ENCOUNTER — Other Ambulatory Visit: Payer: Self-pay

## 2021-05-26 DIAGNOSIS — Z3041 Encounter for surveillance of contraceptive pills: Secondary | ICD-10-CM

## 2021-05-26 MED ORDER — NORGESTIMATE-ETH ESTRADIOL 0.25-35 MG-MCG PO TABS: 1.0000 | ORAL_TABLET | Freq: Every day | ORAL | 0 refills | Status: DC

## 2021-05-26 NOTE — Telephone Encounter (Signed)
Susan Cohen [facility] Triage Can a refill be sent to pharmacy patient is sched for aex 06/25/21 and need a refill until the appointment.

## 2021-06-22 NOTE — Progress Notes (Signed)
26 y.o. No obstetric history on file. Single Other or two or more races Not Hispanic or Latino female here for annual exam.  Sexually active, same partner x 3 years. Don't live together. Period Cycle (Days): 28 Period Duration (Days): 3-7 Period Pattern: Regular Menstrual Flow: Light Menstrual Control: Tampon Menstrual Control Change Freq (Hours): 1-2 Dysmenorrhea: (!) Mild Dysmenorrhea Symptoms: Cramping She will saturate an ultra tampon in 4-6 hours.  Patient's last menstrual period was 06/13/2021.          Sexually active: Yes.    The current method of family planning is OCP (estrogen/progesterone).    Exercising: Yes.     Running  Smoker:  no  Health Maintenance: Pap:  03/26/20 ASCUS  hpv Neg  History of abnormal Pap:  yes MMG:  none  BMD:   none  Colonoscopy: none  TDaP:  2022 Gardasil: completed x 3    reports that she has never smoked. She has never used smokeless tobacco. She reports current alcohol use. She reports that she does not use drugs. Rare ETOH. 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 1 Inhaler 6   norgestimate-ethinyl estradiol (ORTHO-CYCLEN) 0.25-35 MG-MCG tablet Take 1 tablet by mouth daily. 84 tablet 0   No current facility-administered medications for this visit.    Family History  Problem Relation Age of Onset   Healthy Mother    Healthy Father    Healthy Sister     Review of Systems  All other systems reviewed and are negative.  Exam:   BP 124/68    Pulse 81    Ht 5\' 6"  (1.676 m)    Wt 123 lb 3.2 oz (55.9 kg)    LMP 06/13/2021    SpO2 100%    BMI 19.89 kg/m   Weight  change: @WEIGHTCHANGE @ Height:   Height: 5\' 6"  (167.6 cm)  Ht Readings from Last 3 Encounters:  06/25/21 5\' 6"  (1.676 m)  09/02/20 5\' 5"  (1.651 m)  03/26/20 5\' 4"  (1.626 m)    General appearance: alert, cooperative and appears stated age Head: Normocephalic, without obvious abnormality, atraumatic Neck: no adenopathy, supple, symmetrical, trachea midline and thyroid normal to inspection and palpation Lungs: clear to auscultation bilaterally Cardiovascular: regular rate and rhythm Breasts: normal appearance, no masses or tenderness Abdomen: soft, non-tender; non distended,  no masses,  no organomegaly Extremities: extremities normal, atraumatic, no cyanosis or edema Skin: Skin color, texture, turgor normal. No rashes or lesions Lymph nodes: Cervical, supraclavicular, and axillary nodes normal. No abnormal inguinal nodes palpated Neurologic: Grossly normal   Pelvic: External genitalia:  no lesions              Urethra:  normal appearing urethra with no masses, tenderness or lesions              Bartholins and Skenes: normal                 Vagina: normal appearing vagina with normal color and discharge, no lesions              Cervix: no lesions  Bimanual Exam:  Uterus:  normal size, contour, position, consistency, mobility, non-tender              Adnexa: no mass, fullness, tenderness               Rectovaginal: deferred  Gae Dry chaperoned for the exam.  1. Well woman exam Discussed breast self exam Discussed calcium and vit D intake Declines std testing Pap UTD Declines screening labs  2. Encounter for surveillance of contraceptive pills Doing well - norgestimate-ethinyl estradiol (ORTHO-CYCLEN) 0.25-35 MG-MCG tablet; Take 1 tablet by mouth daily.  Dispense: 84 tablet; Refill: 3

## 2021-06-25 ENCOUNTER — Ambulatory Visit (INDEPENDENT_AMBULATORY_CARE_PROVIDER_SITE_OTHER): Payer: Managed Care, Other (non HMO) | Admitting: Obstetrics and Gynecology

## 2021-06-25 ENCOUNTER — Encounter: Payer: Self-pay | Admitting: Obstetrics and Gynecology

## 2021-06-25 ENCOUNTER — Other Ambulatory Visit: Payer: Self-pay

## 2021-06-25 VITALS — BP 124/68 | HR 81 | Ht 66.0 in | Wt 123.2 lb

## 2021-06-25 DIAGNOSIS — Z3041 Encounter for surveillance of contraceptive pills: Secondary | ICD-10-CM | POA: Diagnosis not present

## 2021-06-25 DIAGNOSIS — Z01419 Encounter for gynecological examination (general) (routine) without abnormal findings: Secondary | ICD-10-CM | POA: Diagnosis not present

## 2021-06-25 MED ORDER — NORGESTIMATE-ETH ESTRADIOL 0.25-35 MG-MCG PO TABS: 1.0000 | ORAL_TABLET | Freq: Every day | ORAL | 3 refills | Status: DC

## 2021-06-25 NOTE — Patient Instructions (Signed)

## 2022-05-04 ENCOUNTER — Encounter: Payer: Self-pay | Admitting: Nurse Practitioner

## 2022-05-04 ENCOUNTER — Ambulatory Visit (INDEPENDENT_AMBULATORY_CARE_PROVIDER_SITE_OTHER): Payer: Managed Care, Other (non HMO) | Admitting: Nurse Practitioner

## 2022-05-04 VITALS — BP 124/76 | HR 66 | Temp 97.9°F | Resp 10 | Ht 65.5 in | Wt 123.0 lb

## 2022-05-04 DIAGNOSIS — Z0000 Encounter for general adult medical examination without abnormal findings: Secondary | ICD-10-CM

## 2022-05-04 DIAGNOSIS — Z13220 Encounter for screening for lipoid disorders: Secondary | ICD-10-CM

## 2022-05-04 LAB — LIPID PANEL
Cholesterol: 113 mg/dL (ref 0–200)
HDL: 49.1 mg/dL (ref >39.00)
LDL Cholesterol: 50 mg/dL (ref 0–99)
NonHDL: 63.4
Total CHOL/HDL Ratio: 2
Triglycerides: 65 mg/dL (ref 0.0–149.0)
VLDL: 13 mg/dL (ref 0.0–40.0)

## 2022-05-04 LAB — CBC
HCT: 37.9 % (ref 36.0–46.0)
Hemoglobin: 13 g/dL (ref 12.0–15.0)
MCHC: 34.3 g/dL (ref 30.0–36.0)
MCV: 88.4 fl (ref 78.0–100.0)
Platelets: 193 10*3/uL (ref 150.0–400.0)
RBC: 4.29 Mil/uL (ref 3.87–5.11)
RDW: 12.9 % (ref 11.5–15.5)
WBC: 7.2 10*3/uL (ref 4.0–10.5)

## 2022-05-04 LAB — COMPREHENSIVE METABOLIC PANEL
ALT: 10 U/L (ref 0–35)
AST: 12 U/L (ref 0–37)
Albumin: 3.8 g/dL (ref 3.5–5.2)
Alkaline Phosphatase: 62 U/L (ref 39–117)
BUN: 16 mg/dL (ref 6–23)
CO2: 29 mEq/L (ref 19–32)
Calcium: 8.6 mg/dL (ref 8.4–10.5)
Chloride: 105 mEq/L (ref 96–112)
Creatinine, Ser: 0.82 mg/dL (ref 0.40–1.20)
GFR: 100.22 mL/min (ref >60.00)
Glucose, Bld: 86 mg/dL (ref 70–99)
Potassium: 4.1 mEq/L (ref 3.5–5.1)
Sodium: 139 mEq/L (ref 135–145)
Total Bilirubin: 0.8 mg/dL (ref 0.2–1.2)
Total Protein: 6.6 g/dL (ref 6.0–8.3)

## 2022-05-04 LAB — TSH: TSH: 2.51 u[IU]/mL (ref 0.35–5.50)

## 2022-05-04 NOTE — Progress Notes (Signed)
New Patient Office Visit  Subjective    Patient ID: Susan Cohen, female    DOB: [birth year]  Age: 26 y.o. MRN: [id number]  CC:  Chief Complaint  Patient presents with   Establish Care    No PCP in awhile but does have gyn in the chart   Annual Exam    Form filled out for nursing school    HPI Susan Cohen presents to establish care  for complete physical and follow up of chronic conditions.  Immunizations: -Tetanus: up to date through work -Influenza: up-to-date through work -Shingles: Too young -Pneumonia: Too young  -HPV: Up-to-date  Diet: Fair diet. 4 meals and graze through out the day. Coffee and water  Exercise: run 3 times a week 4-5 miles. Lift 4 times a week at an hour each time  Eye exam: Completes annually Contacts and eyeglasses. My eye doc  Dental exam: Needs updating   Pap Smear: Completed in 2021 through Dr. Gertie Exon Mammogram: Too young  Colonoscopy: Too young Lung Cancer Screening: N/A Dexa: N/A  Sleep: Goes to bed around 1020 with work up at 5. Feels rested. States that she snores softly   Sexually active and has sex with men.  Patient endorses practicing safe sex and denies concerns for STI  Patient is starting school at [employer] for the nursing program and has a form that needs to be filled out by her primary care provider.    Outpatient Encounter Medications as of 05/04/2022  Medication Sig   norgestimate-ethinyl estradiol (ORTHO-CYCLEN) 0.25-35 MG-MCG tablet Take 1 tablet by mouth daily.   [DISCONTINUED] albuterol (PROVENTIL HFA;VENTOLIN HFA) 108 (90 Base) MCG/ACT inhaler Inhale 2 puffs into the lungs every 6 (six) hours as needed for wheezing or shortness of breath.   [DISCONTINUED] beclomethasone (QVAR) 80 MCG/ACT inhaler Inhale 2 puffs into the lungs 2 (two) times daily.   [DISCONTINUED] FERROCITE 324 MG TABS tablet TAKE 1 TABLET BY MOUTH EVERY DAY   [DISCONTINUED] ferrous sulfate 325 (65 FE) MG tablet TAKE 1 TABLET  (325 MG TOTAL) BY MOUTH DAILY WITH BREAKFAST.   [DISCONTINUED] ipratropium (ATROVENT) 0.03 % nasal spray Place 2 sprays into the nose 4 (four) times daily.   [DISCONTINUED] montelukast (SINGULAIR) 10 MG tablet Take 1 tablet (10 mg total) by mouth at bedtime.   [DISCONTINUED] nitroGLYCERIN (NITRODUR - DOSED IN MG/24 HR) 0.2 mg/hr patch Place 1/4 patch to affected area daily   No facility-administered encounter medications on file as of 05/04/2022.    Past Medical History:  Diagnosis Date   Anemia    Asthma    History of iron deficiency     Past Surgical History:  Procedure Laterality Date   ADENOIDECTOMY     TYMPANOSTOMY TUBE PLACEMENT      Family History  Problem Relation Age of Onset   Hypertension Mother    Healthy Father    Other Sister        low iron   Thyroid disease Maternal Grandmother    Other Maternal Grandmother        enlarged heart valve   Rheum arthritis Maternal Grandfather    Hypertension Paternal Grandmother    Breast cancer Paternal Grandmother    Thyroid disease Paternal Grandmother    Ovarian cancer Maternal Great-grandmother    Breast cancer Other     Social History   Socioeconomic History   Marital status: Single    Spouse name: Not on file   Number of children: 0   Years  of education: Not on file   Highest education level: High school graduate  Occupational History   Not on file  Tobacco Use   Smoking status: Never   Smokeless tobacco: Never  Vaping Use   Vaping Use: Never used  Substance and Sexual Activity   Alcohol use: Yes    Comment: occassionally. once every 3  months   Drug use: Never   Sexual activity: Yes    Birth control/protection: Pill  Other Topics Concern   Not on file  Social History Narrative   Fulltime: .      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  Respiratory:  Negative for shortness of breath.   Cardiovascular:  Negative for chest pain and leg swelling.  Gastrointestinal:  Negative for abdominal pain, blood in stool, diarrhea, nausea and vomiting.       BM daily  Genitourinary:  Negative for dysuria and hematuria.  Neurological:  Negative for tingling and headaches.  Psychiatric/Behavioral:  Negative for hallucinations and suicidal ideas.         Objective    BP 124/76   Pulse 66   Temp 97.9 F (36.6 C) (Oral)   Resp 10   Ht 5' 5.5" (1.664 m)   Wt 123 lb (55.8 kg)   LMP 04/13/2022   SpO2 98%   BMI 20.16 kg/m   Physical Exam Vitals and nursing note reviewed. Exam conducted with a chaperone present.  Constitutional:      Appearance: Normal appearance.  HENT:     Right Ear: Tympanic membrane, ear canal and external ear normal.     Left Ear: Tympanic membrane, ear canal and external ear normal.     Mouth/Throat:     Mouth: Mucous membranes are moist.     Pharynx: Oropharynx is clear.  Eyes:     Extraocular Movements: Extraocular movements intact.     Pupils: Pupils are equal, round, and reactive to light.     Comments: Contacts  Cardiovascular:     Rate and Rhythm: Normal rate and regular rhythm.     Pulses: Normal pulses.     Heart sounds: Normal heart sounds.  Pulmonary:     Effort: Pulmonary effort is normal.     Breath sounds: Normal breath sounds.  Abdominal:     General: Bowel sounds are normal. There is no distension.     Palpations: There is no mass.     Tenderness: There is no abdominal tenderness.     Hernia: No hernia is present.  Musculoskeletal:     Right lower leg: No edema.     Left lower leg: No edema.  Lymphadenopathy:     Cervical: No cervical adenopathy.  Skin:    General:  Skin is warm.  Neurological:     General: No focal deficit present.     Mental Status: She is alert.     Deep Tendon Reflexes:     Reflex Scores:      Bicep reflexes are 2+ on the right side and 2+ on the left side.  Patellar reflexes are 2+ on the right side and 2+ on the left side.    Comments: Bilateral upper and lower extremity strength 5/5  Psychiatric:        Mood and Affect: Mood normal.        Behavior: Behavior normal.        Thought Content: Thought content normal.        Judgment: Judgment normal.         Assessment & Plan:   Problem List Items Addressed This Visit       Other   Preventative health care - Primary    Discussed age-appropriate immunizations and screening exams.  Patient declined STI testing at this juncture.  She is followed by OB/GYN.  Patient was given information at discharge about preventative healthcare maintenance with anticipatory guidance for patient her age range. 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  As well as filled out copy to be scanned to chart and original given back to patient.      Relevant Orders   CBC   Comprehensive metabolic panel   Lipid panel   TSH   Other Visit Diagnoses     Screening for lipid disorders       Relevant Orders   Lipid panel       Return in about 1 year (around 05/05/2023) for CPE and labs.   Audria Nine, NP

## 2022-05-04 NOTE — Patient Instructions (Signed)
Nice to see you today I will be in touch with the labs once I have the results Follow up with me in 1 year for the next physical, sooner if you need me Good luck in nursing school!

## 2022-05-04 NOTE — Assessment & Plan Note (Signed)
Discussed age-appropriate immunizations and screening exams.  Patient declined STI testing at this juncture.  She is followed by OB/GYN.  Patient was given information at discharge about preventative healthcare maintenance with anticipatory guidance for patient her age range. 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  As well as filled out copy to be scanned to chart and original given back to patient.

## 2022-08-09 NOTE — Progress Notes (Deleted)
27 y.o. No obstetric history on file. Single Other or two or more races Not Hispanic or Latino female here for annual exam.      No LMP recorded.          Sexually active: {yes no:314532}  The current method of family planning is {contraception:315051}.    Exercising: {yes no:314532}  {types:19826} Smoker:  {YES V2345720  Health Maintenance: Pap:  03/26/20 ASCUS  hpv Neg  History of abnormal Pap:  yes MMG:  none  BMD:   none  Colonoscopy: none  TDaP:  2022 Gardasil: completed x 3    reports that she has never smoked. She has never used smokeless tobacco. She reports current alcohol use. She reports that she does not use drugs.  Past Medical History:  Diagnosis Date   Anemia    Asthma    History of iron deficiency     Past Surgical History:  Procedure Laterality Date   ADENOIDECTOMY     TYMPANOSTOMY TUBE PLACEMENT      Current Outpatient Medications  Medication Sig Dispense Refill   norgestimate-ethinyl estradiol (ORTHO-CYCLEN) 0.25-35 MG-MCG tablet Take 1 tablet by mouth daily. 84 tablet 3   No current facility-administered medications for this visit.    Family History  Problem Relation Age of Onset   Hypertension Mother    Healthy Father    Other Sister        low iron   Thyroid disease Maternal Grandmother    Other Maternal Grandmother        enlarged heart valve   Rheum arthritis Maternal Grandfather    Hypertension Paternal Grandmother    Breast cancer Paternal Grandmother    Thyroid disease Paternal Grandmother    Ovarian cancer Maternal Great-grandmother    Breast cancer Other     Review of Systems  Exam:   There were no vitals taken for this visit.  Weight change: '@WEIGHTCHANGE'$ @ Height:      Ht Readings from Last 3 Encounters:  05/04/22 5' 5.5" (1.664 m)  06/25/21 '5\' 6"'$  (1.676 m)  09/02/20 '5\' 5"'$  (1.651 m)    General appearance: alert, cooperative and appears stated age Head: Normocephalic, without obvious abnormality, atraumatic Neck: no  adenopathy, supple, symmetrical, trachea midline and thyroid {CHL AMB PHY EX THYROID NORM DEFAULT:[phone]::"normal to inspection and palpation"} Lungs: clear to auscultation bilaterally Cardiovascular: regular rate and rhythm Breasts: {Exam; breast:13139::"normal appearance, no masses or tenderness"} Abdomen: soft, non-tender; non distended,  no masses,  no organomegaly Extremities: extremities normal, atraumatic, no cyanosis or edema Skin: Skin color, texture, turgor normal. No rashes or lesions Lymph nodes: Cervical, supraclavicular, and axillary nodes normal. No abnormal inguinal nodes palpated Neurologic: Grossly normal   Pelvic: External genitalia:  no lesions              Urethra:  normal appearing urethra with no masses, tenderness or lesions              Bartholins and Skenes: normal                 Vagina: normal appearing vagina with normal color and discharge, no lesions              Cervix: {CHL AMB PHY EX CERVIX NORM [phone]::"no lesions"}               Bimanual Exam:  Uterus:  {CHL AMB PHY EX UTERUS NORM DEFAULT:[id number]::"normal size, contour, position, consistency, mobility, non-tender"}  Adnexa: {CHL AMB PHY EX ADNEXA NO MASS DEFAULT:[phone]::"no mass, fullness, tenderness"}               Rectovaginal: Confirms               Anus:  normal sphincter tone, no lesions  *** chaperoned for the exam.  A:  Well Woman with normal exam  P:

## 2022-08-16 ENCOUNTER — Other Ambulatory Visit: Payer: Self-pay | Admitting: Obstetrics and Gynecology

## 2022-08-16 DIAGNOSIS — Z3041 Encounter for surveillance of contraceptive pills: Secondary | ICD-10-CM

## 2022-08-18 ENCOUNTER — Ambulatory Visit: Payer: Managed Care, Other (non HMO) | Admitting: Obstetrics and Gynecology

## 2022-08-22 ENCOUNTER — Other Ambulatory Visit: Payer: Self-pay

## 2022-08-22 DIAGNOSIS — Z3041 Encounter for surveillance of contraceptive pills: Secondary | ICD-10-CM

## 2022-08-22 NOTE — Telephone Encounter (Signed)
AEX scheduled 10/25/2022  Last AEX 06/25/2021  Requesting OC refill til visit.

## 2022-08-23 MED ORDER — NORGESTIMATE-ETH ESTRADIOL 0.25-35 MG-MCG PO TABS: 1.0000 | ORAL_TABLET | Freq: Every day | ORAL | 0 refills | Status: DC

## 2022-10-24 NOTE — Progress Notes (Unsigned)
27 y.o. No obstetric history on file. Single Other or two or more races Not Hispanic or Latino female here for annual exam.  She is with the same long term partner. No dyspareunia.  Period Cycle (Days): 28 Period Duration (Days): 4-5 Period Pattern: Regular Menstrual Flow: Moderate Menstrual Control: Tampon Menstrual Control Change Freq (Hours): 3 Dysmenorrhea: (!) Mild Dysmenorrhea Symptoms: Cramping  Patient's last menstrual period was 10/21/2022.          Sexually active: Yes.    The current method of family planning is OCP (estrogen/progesterone).    Exercising: Yes.     runnimg Smoker:  no  Health Maintenance: Pap: 03/26/20 ASCUS  hpv Neg  History of abnormal Pap:  yes MMG:  none  BMD:   none  Colonoscopy: none  TDaP:  2022 Gardasil: completed x 3    reports that she has never smoked. She has never used smokeless tobacco. She reports current alcohol use. She reports that she does not use drugs. 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 84 tablet 0   No current facility-administered medications for this visit.    Family History  Problem Relation Age of Onset   Hypertension Mother    Healthy Father    Other Sister        low iron   Thyroid disease Maternal Grandmother    Other Maternal Grandmother        enlarged heart valve   Rheum arthritis Maternal Grandfather    Hypertension Paternal Grandmother    Breast cancer Paternal Grandmother    Thyroid disease Paternal Grandmother    Ovarian cancer Maternal Great-grandmother    Breast cancer Other     Review of Systems  All other systems reviewed and  are negative.   Exam:   BP 110/74   Pulse 68   Ht 5' 5.35" (1.66 m)   Wt 117 lb (53.1 kg)   LMP 10/21/2022   SpO2 100%   BMI 19.26 kg/m   Weight change: @WEIGHTCHANGE @ Height:   Height: 5' 5.35" (166 cm)  Ht Readings from Last 3 Encounters:  10/25/22 5' 5.35" (1.66 m)  05/04/22 5' 5.5" (1.664 m)  06/25/21 5\' 6"  (1.676 m)    General appearance: alert, cooperative and appears stated age Head: Normocephalic, without obvious abnormality, atraumatic Neck: no adenopathy, supple, symmetrical, trachea midline and thyroid normal to inspection and palpation Lungs: clear to auscultation bilaterally Cardiovascular: regular rate and rhythm Breasts: normal appearance, no masses or tenderness Abdomen: soft, non-tender; non distended,  no masses,  no organomegaly Extremities: extremities normal, atraumatic, no cyanosis or edema Skin: Skin color, texture, turgor normal. No rashes or lesions Lymph nodes: Cervical, supraclavicular, and axillary nodes normal. No abnormal inguinal nodes palpated Neurologic: Grossly normal   Pelvic: External genitalia:  no lesions              Urethra:  normal appearing urethra with no masses, tenderness or lesions              Bartholins and Skenes: normal  Vagina: normal appearing vagina with moderate blood              Cervix: no lesions               Bimanual Exam:  Uterus:  normal size, contour, position, consistency, mobility, non-tender              Adnexa: no mass, fullness, tenderness               Rectovaginal: Confirms               Anus:  normal sphincter tone, no lesions  Carolynn Serve, CMA chaperoned for the exam.  1. Well woman exam Discussed breast self exam Discussed calcium and vit D intake Labs are UTD  2. Encounter for surveillance of contraceptive pills Doing well - norgestimate-ethinyl estradiol (ORTHO-CYCLEN) 0.25-35 MG-MCG tablet; Take 1 tablet by mouth daily.  Dispense: 84 tablet; Refill: 3  3. Screening for  cervical cancer - Cytology - PAP  4. Screening examination for STD (sexually transmitted disease) - Cytology - PAP

## 2022-10-25 ENCOUNTER — Encounter: Payer: Self-pay | Admitting: Obstetrics and Gynecology

## 2022-10-25 ENCOUNTER — Other Ambulatory Visit (HOSPITAL_COMMUNITY)
Admission: RE | Admit: 2022-10-25 | Discharge: 2022-10-25 | Disposition: A | Discharge status: Home / Self Care | Payer: Managed Care, Other (non HMO) | Source: Ambulatory Visit | Attending: Obstetrics and Gynecology | Admitting: Obstetrics and Gynecology

## 2022-10-25 ENCOUNTER — Ambulatory Visit (INDEPENDENT_AMBULATORY_CARE_PROVIDER_SITE_OTHER): Payer: Managed Care, Other (non HMO) | Admitting: Obstetrics and Gynecology

## 2022-10-25 VITALS — BP 110/74 | HR 68 | Ht 65.35 in | Wt 117.0 lb

## 2022-10-25 DIAGNOSIS — Z113 Encounter for screening for infections with a predominantly sexual mode of transmission: Secondary | ICD-10-CM | POA: Diagnosis not present

## 2022-10-25 DIAGNOSIS — Z3041 Encounter for surveillance of contraceptive pills: Secondary | ICD-10-CM

## 2022-10-25 DIAGNOSIS — Z124 Encounter for screening for malignant neoplasm of cervix: Secondary | ICD-10-CM | POA: Diagnosis present

## 2022-10-25 DIAGNOSIS — Z01419 Encounter for gynecological examination (general) (routine) without abnormal findings: Secondary | ICD-10-CM

## 2022-10-25 MED ORDER — NORGESTIMATE-ETH ESTRADIOL 0.25-35 MG-MCG PO TABS: 1.0000 | ORAL_TABLET | Freq: Every day | ORAL | 3 refills | Status: DC

## 2022-10-25 NOTE — Patient Instructions (Signed)

## 2022-11-02 LAB — CYTOLOGY - PAP
Adequacy: ABNORMAL
Chlamydia: NEGATIVE
Comment: NEGATIVE
Comment: NORMAL
Neisseria Gonorrhea: NEGATIVE

## 2022-11-15 ENCOUNTER — Ambulatory Visit (INDEPENDENT_AMBULATORY_CARE_PROVIDER_SITE_OTHER): Payer: Managed Care, Other (non HMO) | Admitting: Obstetrics and Gynecology

## 2022-11-15 ENCOUNTER — Encounter: Payer: Self-pay | Admitting: Obstetrics and Gynecology

## 2022-11-15 ENCOUNTER — Other Ambulatory Visit (HOSPITAL_COMMUNITY)
Admission: RE | Admit: 2022-11-15 | Discharge: 2022-11-15 | Disposition: A | Discharge status: Home / Self Care | Payer: Managed Care, Other (non HMO) | Source: Ambulatory Visit | Attending: Obstetrics and Gynecology | Admitting: Obstetrics and Gynecology

## 2022-11-15 VITALS — BP 120/74 | HR 68 | Wt 120.0 lb

## 2022-11-15 DIAGNOSIS — Z124 Encounter for screening for malignant neoplasm of cervix: Secondary | ICD-10-CM

## 2022-11-15 NOTE — Progress Notes (Signed)
GYNECOLOGY  VISIT   HPI: 27 y.o.   Single Other or two or more races Not Hispanic or Latino  female   No obstetric history on file. with Patient's last menstrual period was 10/21/2022.   here for repeat pap.   GYNECOLOGIC HISTORY: Patient's last menstrual period was 10/21/2022. Contraception:ocp Menopausal hormone therapy: none         OB History   No obstetric history on file.        Patient Active Problem List   Diagnosis Date Noted   Preventative health care 05/04/2022   Subacromial bursitis of right shoulder joint 04/27/2016   Asthma 03/24/2015   Dysfunctional uterine bleeding 01/21/2014   Anemia, iron deficiency 12/10/2013   Low ferritin level 12/10/2013   Left hamstring injury 08/13/2013    Past Medical History:  Diagnosis Date   Anemia    Asthma    History of iron deficiency     Past Surgical History:  Procedure Laterality Date   ADENOIDECTOMY     TYMPANOSTOMY TUBE PLACEMENT      Current Outpatient Medications  Medication Sig Dispense Refill   norgestimate-ethinyl estradiol (ORTHO-CYCLEN) 0.25-35 MG-MCG tablet Take 1 tablet by mouth daily. 84 tablet 3   No current facility-administered medications for this visit.     ALLERGIES: Patient has no known allergies.  Family History  Problem Relation Age of Onset   Hypertension Mother    Healthy Father    Other Sister        low iron   Thyroid disease Maternal Grandmother    Other Maternal Grandmother        enlarged heart valve   Rheum arthritis Maternal Grandfather    Hypertension Paternal Grandmother    Breast cancer Paternal Grandmother    Thyroid disease Paternal Grandmother    Ovarian cancer Maternal Great-grandmother    Breast cancer Other     Social History   Socioeconomic History   Marital status: Single    Spouse name: Not on file   Number of children: 0   Years of education: Not on file   Highest education level: High school graduate  Occupational History   Not on file  Tobacco  Use   Smoking status: Never   Smokeless tobacco: Never  Vaping Use   Vaping Use: Never used  Substance and Sexual Activity   Alcohol use: Yes    Comment: occassionally. once every 3  months   Drug use: Never   Sexual activity: Yes    Birth control/protection: Pill  Other Topics Concern   Not on file  Social History Narrative   Fulltime: [employer].      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   PHYSICAL EXAMINATION:    LMP 10/21/2022     General appearance: alert, cooperative and appears stated age  Pelvic: External genitalia:  no lesions              Urethra:  normal appearing urethra with no masses, tenderness or lesions              Bartholins and  Skenes: normal                 Vagina: normal appearing vagina with normal color and discharge, no lesions              Cervix: no lesions               Chaperone, Carolynn Serve, CMA was present for exam.   1. Screening for cervical cancer Pap at annual exam was unsatisfactory - Cytology - PAP

## 2022-11-17 LAB — CYTOLOGY - PAP
Chlamydia: NEGATIVE
Comment: NEGATIVE
Comment: NORMAL
Diagnosis: NEGATIVE
Neisseria Gonorrhea: NEGATIVE

## 2023-02-17 ENCOUNTER — Encounter: Payer: Self-pay | Admitting: Nurse Practitioner

## 2023-02-17 ENCOUNTER — Ambulatory Visit (INDEPENDENT_AMBULATORY_CARE_PROVIDER_SITE_OTHER): Payer: Managed Care, Other (non HMO) | Admitting: Nurse Practitioner

## 2023-02-17 VITALS — BP 110/80 | HR 88 | Temp 97.6°F | Ht 65.35 in | Wt 121.4 lb

## 2023-02-17 DIAGNOSIS — R41840 Attention and concentration deficit: Secondary | ICD-10-CM | POA: Insufficient documentation

## 2023-02-17 MED ORDER — BUPROPION HCL ER (XL) 150 MG PO TB24: 150.0000 mg | ORAL_TABLET | Freq: Every day | ORAL | 1 refills | Status: DC

## 2023-02-17 NOTE — Progress Notes (Signed)
Acute Office Visit  Subjective:     Patient ID: Susan Cohen, female    DOB: 08/17/1995, 27 y.o.   MRN: [id number]  Chief Complaint  Patient presents with   ADHD    Pt complains of struggling with ADHD symptoms; can't focus, hyperactivity, irritability. States it is affecting work and school life and would like to discuss ADHD medications and treatment.     HPI Patient is in today for concentration deficit   States that she has always have troubel with hyperactivity and focu. States that her mother is a Runner, broadcasting/film/video and always fought. High school started having diffiuclty with AP classes and some college classes. States that she did ok the first  degree. States that she is struggling with nursing school currently.   States that she does have trouble getting and staying alseep. 4-5 hours of sleep at night currently.  Patient does work as a Psychologist, sport and exercise in Geophysicist/field seismologist.  Review of Systems  Constitutional:  Negative for chills and fever.  Respiratory:  Negative for shortness of breath.   Cardiovascular:  Negative for chest pain.  Neurological:  Negative for headaches.  Psychiatric/Behavioral:  Negative for hallucinations and suicidal ideas.         Objective:    BP 110/80   Pulse 88   Temp 97.6 F (36.4 C) (Temporal)   Ht 5' 5.35" (1.66 m)   Wt 121 lb 6.4 oz (55.1 kg)   LMP 02/17/2023 (Exact Date)   SpO2 98%   BMI 19.99 kg/m    Physical Exam Vitals and nursing note reviewed.  Constitutional:      Appearance: Normal appearance.  Cardiovascular:     Rate and Rhythm: Normal rate and regular rhythm.     Heart sounds: Normal heart sounds.  Pulmonary:     Effort: Pulmonary effort is normal.     Breath sounds: Normal breath sounds.  Neurological:     Mental Status: She is alert.  Psychiatric:        Mood and Affect: Affect is tearful.        Speech: Speech normal.        Behavior: Behavior is hyperactive.     No results found for any visits on  02/17/23.      Assessment & Plan:   Problem List Items Addressed This Visit       Other   Concentration deficit - Primary    Patient states that her mother felt like she had growing up but was never tested or medicated.  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  Ambulatory referral to psychology for official ADHD/ADD testing.  Will start patient on Wellbutrin 150 mg daily.  She will reach out to me via MyChart if the medication is helpful or not      Relevant Medications   buPROPion (WELLBUTRIN XL) 150 MG 24 hr tablet   Other Relevant Orders   Ambulatory referral to Psychology    Meds ordered this encounter  Medications   buPROPion (WELLBUTRIN XL) 150 MG 24 hr tablet    Sig: Take 1 tablet (150 mg total) by mouth daily.    Dispense:  30 tablet    Refill:  1    Order Specific Question:   Supervising Provider    Answer:   TOWER, MARNE A [[id number]]    Return in about 3 months (  around 05/19/2023) for CPE and Labs.  Audria Nine, NP

## 2023-02-17 NOTE — Patient Instructions (Addendum)
Nice to see you today I have placed the referral for testing  I have sent in the Wellbutrin to help with symptoms  Follow up in 3 months for your physical, sooner if you need me

## 2023-02-17 NOTE — Assessment & Plan Note (Signed)
Patient states that her mother felt like she had growing up but was never tested or medicated.  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  Ambulatory referral to psychology for official ADHD/ADD testing.  Will start patient on Wellbutrin 150 mg daily.  She will reach out to me via MyChart if the medication is helpful or not

## 2023-03-11 ENCOUNTER — Other Ambulatory Visit: Payer: Self-pay | Admitting: Nurse Practitioner

## 2023-03-11 DIAGNOSIS — R41840 Attention and concentration deficit: Secondary | ICD-10-CM

## 2023-05-24 ENCOUNTER — Ambulatory Visit: Payer: Managed Care, Other (non HMO) | Admitting: Nurse Practitioner

## 2023-05-24 ENCOUNTER — Encounter: Payer: Self-pay | Admitting: Nurse Practitioner

## 2023-05-24 VITALS — BP 118/80 | HR 78 | Temp 98.3°F | Ht 65.5 in | Wt 116.0 lb

## 2023-05-24 DIAGNOSIS — R41840 Attention and concentration deficit: Secondary | ICD-10-CM

## 2023-05-24 DIAGNOSIS — D509 Iron deficiency anemia, unspecified: Secondary | ICD-10-CM | POA: Diagnosis not present

## 2023-05-24 DIAGNOSIS — Z0000 Encounter for general adult medical examination without abnormal findings: Secondary | ICD-10-CM

## 2023-05-24 DIAGNOSIS — Z114 Encounter for screening for human immunodeficiency virus [HIV]: Secondary | ICD-10-CM | POA: Diagnosis not present

## 2023-05-24 DIAGNOSIS — Z1159 Encounter for screening for other viral diseases: Secondary | ICD-10-CM

## 2023-05-24 LAB — COMPREHENSIVE METABOLIC PANEL
ALT: 8 U/L (ref 0–35)
AST: 11 U/L (ref 0–37)
Albumin: 3.8 g/dL (ref 3.5–5.2)
Alkaline Phosphatase: 58 U/L (ref 39–117)
BUN: 16 mg/dL (ref 6–23)
CO2: 27 meq/L (ref 19–32)
Calcium: 8.9 mg/dL (ref 8.4–10.5)
Chloride: 107 meq/L (ref 96–112)
Creatinine, Ser: 0.88 mg/dL (ref 0.40–1.20)
GFR: 91.4 mL/min (ref >60.00)
Glucose, Bld: 75 mg/dL (ref 70–99)
Potassium: 4.3 meq/L (ref 3.5–5.1)
Sodium: 139 meq/L (ref 135–145)
Total Bilirubin: 1.4 mg/dL — ABNORMAL HIGH (ref 0.2–1.2)
Total Protein: 6.4 g/dL (ref 6.0–8.3)

## 2023-05-24 LAB — CBC
HCT: 37.2 % (ref 36.0–46.0)
Hemoglobin: 12.8 g/dL (ref 12.0–15.0)
MCHC: 34.4 g/dL (ref 30.0–36.0)
MCV: 89.2 fL (ref 78.0–100.0)
Platelets: 165 10*3/uL (ref 150.0–400.0)
RBC: 4.17 Mil/uL (ref 3.87–5.11)
RDW: 12.8 % (ref 11.5–15.5)
WBC: 8 10*3/uL (ref 4.0–10.5)

## 2023-05-24 LAB — IBC + FERRITIN
Ferritin: 11 ng/mL (ref 10.0–291.0)
Iron: 110 ug/dL (ref 42–145)
Saturation Ratios: 26.1 % (ref 20.0–50.0)
TIBC: 421.4 ug/dL (ref 250.0–450.0)
Transferrin: 301 mg/dL (ref 212.0–360.0)

## 2023-05-24 LAB — TSH: TSH: 2.2 u[IU]/mL (ref 0.35–5.50)

## 2023-05-24 NOTE — Assessment & Plan Note (Signed)
Patient had a good response to Wellbutrin 100 mg daily.  She denies HI/SI/AVH.  We will continue medication as prescribed patient has lost some weight and is at the lower end of normal and her BMI we will continue monitoring this to make sure she does not continue losing weight on medication

## 2023-05-24 NOTE — Patient Instructions (Signed)
Nice to see you today I will be in touch with the labs once I have them Follow up with me in 3 months for a weight recheck, sooner if you need me

## 2023-05-24 NOTE — Progress Notes (Signed)
Established Patient Office Visit  Subjective   Patient ID: Susan Cohen, female    DOB: 11-06-1995  Age: 27 y.o. MRN: [id number]  Chief Complaint  Patient presents with   Annual Exam    HPI  for complete physical and follow up of chronic conditions.   Concentration issues: states that she is doing well on the welbutrin. States that she did have headaches in the beginning. States did well in the semester   Immunizations: -Tetanus: Completed  through work  -Influenza: through work  -Shingles: Too young -Pneumonia: Too young -HPV: Up-to-date  Diet: Fair diet. She is eating 3 meals and snack. She will do coffee, engery drink and water  Exercise:  3-4 times a week will do gym for an hour.  This includes cardio and resistance training  Eye exam: Completes annually. Contacts and glasses   Dental exam: Completes semi-annually    Colonoscopy: Too young, currently average risk Lung Cancer Screening: N/A  Pap Smear: GYN this year. State that she did have an abnormal one that required recollection but came back normal thereafter.  Sleep: state that she is getting 6-8 hours. Feels rested.  STI: Patient declines STI testing today.  Recently tested with Pap smear on 11/15/2022       Review of Systems  Constitutional:  Negative for chills and fever.  Respiratory:  Negative for shortness of breath.   Cardiovascular:  Negative for chest pain and leg swelling.  Gastrointestinal:  Negative for abdominal pain, blood in stool, constipation, diarrhea, nausea and vomiting.       BM daily  Genitourinary:  Negative for dysuria and hematuria.  Neurological:  Negative for tingling and headaches.  Psychiatric/Behavioral:  Negative for hallucinations and suicidal ideas.       Objective:     BP 118/80   Pulse 78   Temp 98.3 F (36.8 C) (Oral)   Ht 5' 5.5" (1.664 m)   Wt 116 lb (52.6 kg)   LMP 05/18/2023 (Approximate)   SpO2 99%   BMI 19.01 kg/m  BP Readings from Last 3  Encounters:  05/24/23 118/80  02/17/23 110/80  11/15/22 120/74   Wt Readings from Last 3 Encounters:  05/24/23 116 lb (52.6 kg)  02/17/23 121 lb 6.4 oz (55.1 kg)  11/15/22 120 lb (54.4 kg)   SpO2 Readings from Last 3 Encounters:  05/24/23 99%  02/17/23 98%  11/15/22 100%      Physical Exam Vitals and nursing note reviewed.  Constitutional:      Appearance: Normal appearance.  HENT:     Right Ear: Tympanic membrane, ear canal and external ear normal.     Left Ear: Tympanic membrane, ear canal and external ear normal.     Mouth/Throat:     Mouth: Mucous membranes are moist.     Pharynx: Oropharynx is clear.  Eyes:     Extraocular Movements: Extraocular movements intact.     Pupils: Pupils are equal, round, and reactive to light.  Cardiovascular:     Rate and Rhythm: Normal rate and regular rhythm.     Pulses: Normal pulses.     Heart sounds: Normal heart sounds.  Pulmonary:     Effort: Pulmonary effort is normal.     Breath sounds: Normal breath sounds.  Abdominal:     General: Bowel sounds are normal. There is no distension.     Palpations: There is no mass.     Tenderness: There is no abdominal tenderness.     Hernia: No  hernia is present.  Musculoskeletal:     Right lower leg: No edema.     Left lower leg: No edema.  Lymphadenopathy:     Cervical: No cervical adenopathy.  Skin:    General: Skin is warm.  Neurological:     General: No focal deficit present.     Mental Status: She is alert.     Deep Tendon Reflexes:     Reflex Scores:      Bicep reflexes are 2+ on the right side and 2+ on the left side.      Patellar reflexes are 2+ on the right side and 2+ on the left side.    Comments: Bilateral upper and lower extremity strength 5/5  Psychiatric:        Mood and Affect: Mood normal.        Behavior: Behavior normal.        Thought Content: Thought content normal.        Judgment: Judgment normal.      No results found for any visits on  05/24/23.    The ASCVD Risk score (Arnett DK, et al., 2019) failed to calculate for the following reasons:   The 2019 ASCVD risk score is only valid for ages 49 to 10    Assessment & Plan:   Problem List Items Addressed This Visit       Other   Anemia, iron deficiency   History of the same.  Pending CBC and iron panel      Relevant Orders   IBC + Ferritin   Preventative health care - Primary   Discussed age-appropriate immunizations and screening exams.  Did review patient's personal, surgical, social, family histories.  Patient is up-to-date with all age-appropriate vaccinations she would like.  Patient is too young for CRC screening or breast cancer screening.  She is up-to-date with her cervical cancer screening through her GYN.  Patient was given information at discharge about preventative healthcare maintenance with anticipatory guidance.  Patient declined STI testing currently      Relevant Orders   CBC   Comprehensive metabolic panel   TSH   Concentration deficit   Patient had a good response to Wellbutrin 100 mg daily.  She denies HI/SI/AVH.  We will continue medication as prescribed patient has lost some weight and is at the lower end of normal and her BMI we will continue monitoring this to make sure she does not continue losing weight on medication      Other Visit Diagnoses       Encounter for hepatitis C screening test for low risk patient         Encounter for screening for HIV           Return in about 3 months (around 08/22/2023) for concentratoin and weight recheck .    Audria Nine, NP

## 2023-05-24 NOTE — Assessment & Plan Note (Signed)
History of the same.  Pending CBC and iron panel

## 2023-05-24 NOTE — Assessment & Plan Note (Signed)
Discussed age-appropriate immunizations and screening exams.  Did review patient's personal, surgical, social, family histories.  Patient is up-to-date with all age-appropriate vaccinations she would like.  Patient is too young for CRC screening or breast cancer screening.  She is up-to-date with her cervical cancer screening through her GYN.  Patient was given information at discharge about preventative healthcare maintenance with anticipatory guidance.  Patient declined STI testing currently

## 2023-08-04 ENCOUNTER — Encounter (HOSPITAL_COMMUNITY): Payer: Self-pay

## 2023-08-04 ENCOUNTER — Emergency Department (HOSPITAL_COMMUNITY): Payer: Managed Care, Other (non HMO)

## 2023-08-04 ENCOUNTER — Emergency Department (HOSPITAL_COMMUNITY)
Admission: EM | Admit: 2023-08-04 | Discharge: 2023-08-05 | Disposition: A | Discharge status: Home / Self Care | Payer: Managed Care, Other (non HMO) | Attending: Emergency Medicine | Admitting: Emergency Medicine

## 2023-08-04 ENCOUNTER — Other Ambulatory Visit: Payer: Self-pay

## 2023-08-04 DIAGNOSIS — N83201 Unspecified ovarian cyst, right side: Secondary | ICD-10-CM

## 2023-08-04 DIAGNOSIS — R1031 Right lower quadrant pain: Secondary | ICD-10-CM | POA: Diagnosis not present

## 2023-08-04 DIAGNOSIS — R1030 Lower abdominal pain, unspecified: Secondary | ICD-10-CM | POA: Diagnosis present

## 2023-08-04 DIAGNOSIS — R319 Hematuria, unspecified: Secondary | ICD-10-CM

## 2023-08-04 DIAGNOSIS — R112 Nausea with vomiting, unspecified: Secondary | ICD-10-CM | POA: Diagnosis not present

## 2023-08-04 DIAGNOSIS — R197 Diarrhea, unspecified: Secondary | ICD-10-CM | POA: Insufficient documentation

## 2023-08-04 LAB — COMPREHENSIVE METABOLIC PANEL
ALT: 18 U/L (ref 0–44)
AST: 26 U/L (ref 15–41)
Albumin: 4 g/dL (ref 3.5–5.0)
Alkaline Phosphatase: 69 U/L (ref 38–126)
Anion gap: 14 (ref 5–15)
BUN: 16 mg/dL (ref 6–20)
CO2: 21 mmol/L — ABNORMAL LOW (ref 22–32)
Calcium: 9.5 mg/dL (ref 8.9–10.3)
Chloride: 102 mmol/L (ref 98–111)
Creatinine, Ser: 0.97 mg/dL (ref 0.44–1.00)
GFR, Estimated: >60 mL/min (ref >60)
Glucose, Bld: 133 mg/dL — ABNORMAL HIGH (ref 70–99)
Potassium: 3.4 mmol/L — ABNORMAL LOW (ref 3.5–5.1)
Sodium: 137 mmol/L (ref 135–145)
Total Bilirubin: 1.9 mg/dL — ABNORMAL HIGH (ref 0.0–1.2)
Total Protein: 7.8 g/dL (ref 6.5–8.1)

## 2023-08-04 LAB — URINALYSIS, ROUTINE W REFLEX MICROSCOPIC
Bilirubin Urine: NEGATIVE
Glucose, UA: NEGATIVE mg/dL
Ketones, ur: 20 mg/dL — AB
Leukocytes,Ua: NEGATIVE
Nitrite: NEGATIVE
Protein, ur: 100 mg/dL — AB
RBC / HPF: >50 RBC/hpf (ref 0–5)
Specific Gravity, Urine: 1.024 (ref 1.005–1.030)
pH: 9 — ABNORMAL HIGH (ref 5.0–8.0)

## 2023-08-04 LAB — CBC
HCT: 43.5 % (ref 36.0–46.0)
Hemoglobin: 15.3 g/dL — ABNORMAL HIGH (ref 12.0–15.0)
MCH: 29.9 pg (ref 26.0–34.0)
MCHC: 35.2 g/dL (ref 30.0–36.0)
MCV: 85.1 fL (ref 80.0–100.0)
Platelets: 228 10*3/uL (ref 150–400)
RBC: 5.11 MIL/uL (ref 3.87–5.11)
RDW: 11.9 % (ref 11.5–15.5)
WBC: 20.5 10*3/uL — ABNORMAL HIGH (ref 4.0–10.5)
nRBC: 0 % (ref 0.0–0.2)

## 2023-08-04 LAB — HCG, QUANTITATIVE, PREGNANCY: hCG, Beta Chain, Quant, S: <1 m[IU]/mL (ref <5)

## 2023-08-04 LAB — PREGNANCY, URINE: Preg Test, Ur: NEGATIVE

## 2023-08-04 LAB — LIPASE, BLOOD: Lipase: 27 U/L (ref 11–51)

## 2023-08-04 MED ORDER — SODIUM CHLORIDE 0.9 % IV BOLUS
1000.0000 mL | Freq: Once | INTRAVENOUS | Status: AC
Start: 2023-08-04 — End: 2023-08-04
  Administered 2023-08-04: 1000 mL via INTRAVENOUS

## 2023-08-04 MED ORDER — IOHEXOL 300 MG/ML  SOLN
100.0000 mL | Freq: Once | INTRAMUSCULAR | Status: AC | PRN
  Administered 2023-08-04: 80 mL via INTRAVENOUS

## 2023-08-04 MED ORDER — ONDANSETRON HCL 4 MG/2ML IJ SOLN
4.0000 mg | Freq: Once | INTRAMUSCULAR | Status: AC
  Administered 2023-08-04: 4 mg via INTRAVENOUS
  Filled 2023-08-04: qty 2

## 2023-08-04 MED ORDER — DICYCLOMINE HCL 10 MG/ML IM SOLN
20.0000 mg | Freq: Once | INTRAMUSCULAR | Status: AC
  Administered 2023-08-04: 20 mg via INTRAMUSCULAR
  Filled 2023-08-04: qty 2

## 2023-08-04 MED ORDER — ONDANSETRON HCL 4 MG/2ML IJ SOLN
4.0000 mg | Freq: Once | INTRAMUSCULAR | Status: AC | PRN
  Administered 2023-08-04: 4 mg via INTRAVENOUS
  Filled 2023-08-04: qty 2

## 2023-08-04 NOTE — ED Provider Notes (Signed)
  EMERGENCY DEPARTMENT AT [facility] Provider Note   CSN: [id number] Arrival date & time: 08/04/23  2012     History  Chief Complaint  Patient presents with   Emesis   Diarrhea    Susan Cohen is a 28 y.o. female.  Patient is a 28 year old female who presents to the emergency department with a chief complaint of lower abdominal pain, nausea, vomiting, diarrhea which began earlier today.  Patient notes that she has had no associated fever or chills.  She has had no other known sick contacts.  She notes that it did began after eating this morning.  She denies any melena, hematochezia.  She has had no changes in urination to include dysuria or hematuria.   Emesis Associated symptoms: diarrhea   Diarrhea Associated symptoms: vomiting        Home Medications Prior to Admission medications   Medication Sig Start Date End Date Taking? Authorizing Provider  buPROPion (WELLBUTRIN XL) 150 MG 24 hr tablet TAKE 1 TABLET BY MOUTH EVERY DAY 03/13/23   Eden Emms, NP  norgestimate-ethinyl estradiol (ORTHO-CYCLEN) 0.25-35 MG-MCG tablet Take 1 tablet by mouth daily. 10/25/22   Romualdo Bolk, MD      Allergies    Patient has no known allergies.    Review of Systems   Review of Systems  Gastrointestinal:  Positive for diarrhea and vomiting.  All other systems reviewed and are negative.   Physical Exam Updated Vital Signs BP 120/63   Pulse 98   Temp 98.4 F (36.9 C) (Axillary)   Resp 18   Ht 5\' 5"  (1.651 m)   Wt 52.6 kg   LMP 07/25/2023 (Exact Date)   SpO2 100%   BMI 19.30 kg/m  Physical Exam Vitals reviewed.  Constitutional:      Appearance: Normal appearance.  HENT:     Head: Normocephalic and atraumatic.     Nose: Nose normal.     Mouth/Throat:     Mouth: Mucous membranes are moist.  Eyes:     Extraocular Movements: Extraocular movements intact.     Conjunctiva/sclera: Conjunctivae normal.     Pupils: Pupils are equal, round, and  reactive to light.  Cardiovascular:     Rate and Rhythm: Normal rate and regular rhythm.     Pulses: Normal pulses.     Heart sounds: Normal heart sounds.  Pulmonary:     Effort: Pulmonary effort is normal. No respiratory distress.     Breath sounds: Normal breath sounds.  Abdominal:     General: Abdomen is flat. Bowel sounds are normal. There is no distension.     Palpations: Abdomen is soft. There is no mass.     Tenderness: There is no right CVA tenderness or left CVA tenderness.     Hernia: No hernia is present.     Comments: Tender to palpation to right lower quadrant and suprapubic region  Musculoskeletal:        General: Normal range of motion.     Cervical back: Normal range of motion and neck supple.  Skin:    General: Skin is warm and dry.  Neurological:     General: No focal deficit present.     Mental Status: She is alert and oriented to person, place, and time. Mental status is at baseline.  Psychiatric:        Mood and Affect: Mood normal.        Behavior: Behavior normal.  Thought Content: Thought content normal.        Judgment: Judgment normal.     ED Results / Procedures / Treatments   Labs (all labs ordered are listed, but only abnormal results are displayed) Labs Reviewed  COMPREHENSIVE METABOLIC PANEL - Abnormal; Notable for the following components:      Result Value   Potassium 3.4 (*)    CO2 21 (*)    Glucose, Bld 133 (*)    Total Bilirubin 1.9 (*)    All other components within normal limits  CBC - Abnormal; Notable for the following components:   WBC 20.5 (*)    Hemoglobin 15.3 (*)    All other components within normal limits  URINALYSIS, ROUTINE W REFLEX MICROSCOPIC - Abnormal; Notable for the following components:   pH 9.0 (*)    Hgb urine dipstick SMALL (*)    Ketones, ur 20 (*)    Protein, ur 100 (*)    Bacteria, UA RARE (*)    All other components within normal limits  LIPASE, BLOOD  PREGNANCY, URINE  HCG, QUANTITATIVE,  PREGNANCY    EKG None  Radiology CT ABDOMEN PELVIS W CONTRAST Result Date: 08/04/2023 CLINICAL DATA:  Right lower quadrant pain for several hours, initial encounter EXAM: CT ABDOMEN AND PELVIS WITH CONTRAST TECHNIQUE: Multidetector CT imaging of the abdomen and pelvis was performed using the standard protocol following bolus administration of intravenous contrast. RADIATION DOSE REDUCTION: This exam was performed according to the departmental dose-optimization program which includes automated exposure control, adjustment of the mA and/or kV according to patient size and/or use of iterative reconstruction technique. CONTRAST:  80mL OMNIPAQUE IOHEXOL 300 MG/ML  SOLN COMPARISON:  None Available. FINDINGS: Lower chest: No acute abnormality. Hepatobiliary: No focal liver abnormality is seen. No gallstones, gallbladder wall thickening, or biliary dilatation. Pancreas: Unremarkable. No pancreatic ductal dilatation or surrounding inflammatory changes. Spleen: Normal in size without focal abnormality. Adrenals/Urinary Tract: Adrenal glands are within normal limits. Kidneys demonstrate a normal enhancement pattern bilaterally. Small simple cyst is noted in the left kidney. No follow-up is recommended. No calculi or obstructive changes are seen. The bladder is decompressed. Stomach/Bowel: No obstructive or inflammatory changes of the colon are seen. Fluid is noted throughout the colon and small bowel which may represent a mild diarrheal state. Stomach is distended with ingested food stuffs. Appendix is not well visualized. No inflammatory changes to suggest appendicitis are noted. Vascular/Lymphatic: No significant vascular findings are present. No enlarged abdominal or pelvic lymph nodes. Reproductive: Uterus is within normal limits. No left adnexal mass is seen. 2.7 cm simple cyst is noted within the right adnexa. Other: No abdominal wall hernia or abnormality. No abdominopelvic ascites. Musculoskeletal: No acute or  significant osseous findings. IMPRESSION: Simple appearing cyst within the right adnexa. Given the patient's clinical history however the possibility of torsion would deserve consideration. Ultrasound of the pelvis is recommended for further evaluation. No renal calculi or obstructive changes are noted. The appendix is not well visualized. No inflammatory changes are seen. Electronically Signed   By: Alcide Clever M.D.   On: 08/04/2023 22:14    Procedures Procedures    Medications Ordered in ED Medications  ondansetron [facility]) injection 4 mg (4 mg Intravenous Given 08/04/23 2034)  sodium chloride 0.9 % bolus 1,000 mL (0 mLs Intravenous Stopped 08/04/23 2221)  ondansetron (ZOFRAN) injection 4 mg (4 mg Intravenous Given 08/04/23 2140)  dicyclomine (BENTYL) injection 20 mg (20 mg Intramuscular Given 08/04/23 2115)  iohexol (OMNIPAQUE) 300 MG/ML  solution 100 mL (80 mLs Intravenous Contrast Given 08/04/23 2203)    ED Course/ Medical Decision Making/ A&P                                 Medical Decision Making Patient does remain stable at this time.  CT scan of the abdomen and pelvis does demonstrate a right-sided ovarian cyst.  Radiologist does recommend ultrasound to rule out ovarian torsion and this has been ordered at this time and is pending.  Patient's pain, nausea and vomiting has greatly improved with treatment in the emergency department.  She does have an associated leukocytosis which I do suspect is secondary to demand from her ongoing nausea and vomiting.  CT scan demonstrated no indication for acute appendicitis.  No other acute surgical pathology was noted on CT scan of the abdomen and pelvis.  Will sign patient out to oncoming provider Dr. Clayborne Dana at this time.  Amount and/or Complexity of Data Reviewed Labs: ordered. Radiology: ordered.  Risk Prescription drug management.           Final Clinical Impression(s) / ED Diagnoses Final diagnoses:  None    Rx / DC Orders ED  Discharge Orders     None         Kathlen Mody 08/04/23 2341    Terrilee Files, MD 08/05/23 1114

## 2023-08-04 NOTE — ED Provider Notes (Signed)
 11:33 PM Assumed care from Dr. Charm Barges and Lesle Reek PA, please see their note for full history, physical and decision making until this point. In brief this is a 28 y.o. year old female who presented to the ED tonight with Emesis and Diarrhea     Here with GI illness, RLQ pain and leukocytosis. Large cyst on ct but appendicitis negative. Recommending [place] to r/o torsion. Symptoms improved at this point.   All symptoms improved. [place] negative for torsion but does have cyst, will fu w/ outpatient doc for same. Tolerating PO.   Discharge instructions, including strict return precautions for new or worsening symptoms, given. Patient and/or family verbalized understanding and agreement with the plan as described.   Labs, studies and imaging reviewed by myself and considered in medical decision making if ordered. Imaging interpreted by radiology.  Labs Reviewed  COMPREHENSIVE METABOLIC PANEL - Abnormal; Notable for the following components:      Result Value   Potassium 3.4 (*)    CO2 21 (*)    Glucose, Bld 133 (*)    Total Bilirubin 1.9 (*)    All other components within normal limits  CBC - Abnormal; Notable for the following components:   WBC 20.5 (*)    Hemoglobin 15.3 (*)    All other components within normal limits  URINALYSIS, ROUTINE W REFLEX MICROSCOPIC - Abnormal; Notable for the following components:   pH 9.0 (*)    Hgb urine dipstick SMALL (*)    Ketones, ur 20 (*)    Protein, ur 100 (*)    Bacteria, UA RARE (*)    All other components within normal limits  LIPASE, BLOOD  PREGNANCY, URINE  HCG, QUANTITATIVE, PREGNANCY    CT ABDOMEN PELVIS W CONTRAST  Final Result    US Pelvis Complete    (Results Pending)  US Transvaginal Non-OB    (Results Pending)  [place] Art/Ven Flow Abd Pelv Doppler    (Results Pending)    No follow-ups on file.    Krystol Rocco, Barbara Cower, MD 08/05/23 [phone]

## 2023-08-04 NOTE — ED Triage Notes (Signed)
 Pt with n/v/d since 1400 today. Endorses episodes of vomiting and diarrhea 10+ times. Mid, lower abdominal pain (cramping).

## 2023-08-05 MED ORDER — DICYCLOMINE HCL 20 MG PO TABS: 20.0000 mg | ORAL_TABLET | Freq: Two times a day (BID) | ORAL | 0 refills | Status: AC | PRN

## 2023-08-05 MED ORDER — ONDANSETRON 4 MG PO TBDP: 4.0000 mg | ORAL_TABLET | Freq: Three times a day (TID) | ORAL | 0 refills | Status: AC | PRN

## 2023-08-05 MED ORDER — ONDANSETRON HCL 4 MG PO TABS: 4.0000 mg | ORAL_TABLET | Freq: Three times a day (TID) | ORAL | 0 refills | Status: AC | PRN

## 2023-08-05 NOTE — ED Notes (Signed)
 Pt in ultrasound at this time

## 2023-08-07 LAB — URINE CULTURE: Culture: <10000 — AB

## 2023-08-07 MED FILL — Ondansetron HCl Tab 4 MG: ORAL | Qty: 4 | Status: AC

## 2023-08-22 ENCOUNTER — Ambulatory Visit (INDEPENDENT_AMBULATORY_CARE_PROVIDER_SITE_OTHER): Payer: Managed Care, Other (non HMO) | Admitting: Nurse Practitioner

## 2023-08-22 VITALS — BP 110/72 | HR 70 | Temp 97.9°F | Ht 65.0 in | Wt 118.6 lb

## 2023-08-22 DIAGNOSIS — R41840 Attention and concentration deficit: Secondary | ICD-10-CM

## 2023-08-22 DIAGNOSIS — R634 Abnormal weight loss: Secondary | ICD-10-CM | POA: Insufficient documentation

## 2023-08-22 NOTE — Patient Instructions (Signed)
 Nice to see you today I want to see you in approx 9 months for your physical, sooner if you need me

## 2023-08-22 NOTE — Assessment & Plan Note (Signed)
 Has self corrected. Patient has likely gained more weight but was recently sick with the norovirus per her report.

## 2023-08-22 NOTE — Progress Notes (Signed)
   Established Patient Office Visit  Subjective   Patient ID: Susan Cohen, female    DOB: Aug 16, 1995  Age: 28 y.o. MRN: [id number]  Chief Complaint  Patient presents with   Follow-up    Pt has been doing okay.  Weight last OV was 116. 118 today.     HPI  Concentration issues: Patient was seen by me and started on Wellbutrin 150 mg daily for concentration issues.  Upon follow-up she felt the medication was being beneficial and was helping with life work and school.  Patient was noted to have some decrease in weight.  She was still considered normal on the BMI.  Patient here to follow-up to make sure medications are effective and not having gross weight loss.  States that she is doing well with school. States that she did get the norovirus and thought that she would have lost weight. States that it was with coworkers.  She is eating at least 3 meals and some snacks. She will drink 1 coffee and then water mostly there after She does goe to the gym 5 days. She will do weights and cardio for an hour.     Review of Systems  Constitutional:  Negative for chills and fever.  Respiratory:  Negative for shortness of breath.   Cardiovascular:  Negative for chest pain.  Gastrointestinal:  Negative for abdominal pain, constipation, diarrhea, nausea and vomiting.  Neurological:  Negative for headaches.      Objective:     BP 110/72   Pulse 70   Temp 97.9 F (36.6 C) (Oral)   Ht 5\' 5"  (1.651 m)   Wt 118 lb 9.6 oz (53.8 kg)   LMP 07/25/2023 (Exact Date)   SpO2 98%   BMI 19.74 kg/m  BP Readings from Last 3 Encounters:  08/22/23 110/72  05/24/23 118/80  02/17/23 110/80   Wt Readings from Last 3 Encounters:  08/22/23 118 lb 9.6 oz (53.8 kg)  05/24/23 116 lb (52.6 kg)  02/17/23 121 lb 6.4 oz (55.1 kg)   SpO2 Readings from Last 3 Encounters:  08/22/23 98%  05/24/23 99%  02/17/23 98%      Physical Exam Vitals and nursing note reviewed.  Constitutional:      Appearance: Normal  appearance.  Cardiovascular:     Rate and Rhythm: Normal rate and regular rhythm.     Heart sounds: Normal heart sounds.  Pulmonary:     Effort: Pulmonary effort is normal.     Breath sounds: Normal breath sounds.  Neurological:     Mental Status: She is alert.      No results found for any visits on 08/22/23.    The ASCVD Risk score (Arnett DK, et al., 2019) failed to calculate for the following reasons:   The 2019 ASCVD risk score is only valid for ages 40 to 52    Assessment & Plan:   Problem List Items Addressed This Visit       Other   Concentration deficit   Patient is currently on wellbutrin 150mg  daily and doing well. Continue the wellbutrin 150mg  daily       Unintentional weight loss - Primary   Has self corrected. Patient has likely gained more weight but was recently sick with the norovirus per her report.        Return in about 9 months (around 05/23/2024) for CPE and Labs.    Audria Nine, NP

## 2023-08-22 NOTE — Assessment & Plan Note (Signed)
 Patient is currently on wellbutrin 150mg  daily and doing well. Continue the wellbutrin 150mg  daily

## 2023-10-18 ENCOUNTER — Other Ambulatory Visit: Payer: Self-pay | Admitting: Nurse Practitioner

## 2023-10-18 DIAGNOSIS — R41840 Attention and concentration deficit: Secondary | ICD-10-CM

## 2023-11-08 ENCOUNTER — Other Ambulatory Visit: Payer: Self-pay

## 2023-11-08 DIAGNOSIS — Z3041 Encounter for surveillance of contraceptive pills: Secondary | ICD-10-CM

## 2023-11-08 MED ORDER — NORGESTIMATE-ETH ESTRADIOL 0.25-35 MG-MCG PO TABS: 1.0000 | ORAL_TABLET | Freq: Every day | ORAL | 0 refills | Status: AC

## 2023-11-08 NOTE — Telephone Encounter (Signed)
 Pt LVM in triage line stating that she has now scheduled her AEX and is desiring enough refills on BC until then.  Med refill request: BC[facility] Last AEX: 10/25/2022-JJ Next AEX: 12/07/2023-JC Last MMG (if hormonal med): n/a Refill authorized: rx pend.

## 2023-12-07 ENCOUNTER — Ambulatory Visit: Admitting: Radiology
